# Patient Record
Sex: Male | Born: 1935 | Race: White | Hispanic: No | Smoking: Former smoker
Health system: Southern US, Community
[De-identification: ages and names within clinical notes are randomized; demographics above are authoritative.]

## PROBLEM LIST (undated history)

## (undated) DIAGNOSIS — Z7409 Other reduced mobility: Secondary | ICD-10-CM

## (undated) DIAGNOSIS — J449 Chronic obstructive pulmonary disease, unspecified: Secondary | ICD-10-CM

## (undated) DIAGNOSIS — B351 Tinea unguium: Secondary | ICD-10-CM

## (undated) DIAGNOSIS — C801 Malignant (primary) neoplasm, unspecified: Secondary | ICD-10-CM

## (undated) DIAGNOSIS — N189 Chronic kidney disease, unspecified: Secondary | ICD-10-CM

## (undated) DIAGNOSIS — R011 Cardiac murmur, unspecified: Secondary | ICD-10-CM

## (undated) DIAGNOSIS — D61818 Other pancytopenia: Secondary | ICD-10-CM

## (undated) DIAGNOSIS — R55 Syncope and collapse: Secondary | ICD-10-CM

## (undated) DIAGNOSIS — B029 Zoster without complications: Secondary | ICD-10-CM

## (undated) DIAGNOSIS — K635 Polyp of colon: Secondary | ICD-10-CM

## (undated) DIAGNOSIS — M109 Gout, unspecified: Secondary | ICD-10-CM

## (undated) DIAGNOSIS — H409 Unspecified glaucoma: Secondary | ICD-10-CM

## (undated) DIAGNOSIS — S4290XA Fracture of unspecified shoulder girdle, part unspecified, initial encounter for closed fracture: Secondary | ICD-10-CM

## (undated) DIAGNOSIS — K5900 Constipation, unspecified: Secondary | ICD-10-CM

## (undated) DIAGNOSIS — I340 Nonrheumatic mitral (valve) insufficiency: Secondary | ICD-10-CM

## (undated) HISTORY — PX: ADENOIDECTOMY: SUR15

## (undated) HISTORY — DX: Chronic kidney disease, unspecified: N18.9

## (undated) HISTORY — PX: EYE SURGERY: SHX253

## (undated) HISTORY — PX: TONSILLECTOMY: SUR1361

---

## 1998-01-19 ENCOUNTER — Encounter: Payer: Self-pay | Admitting: Internal Medicine

## 1998-01-19 ENCOUNTER — Ambulatory Visit (HOSPITAL_COMMUNITY): Admission: RE | Admit: 1998-01-19 | Discharge: 1998-01-19 | Payer: Self-pay | Admitting: Internal Medicine

## 1998-12-24 ENCOUNTER — Encounter: Payer: Self-pay | Admitting: Internal Medicine

## 1998-12-24 ENCOUNTER — Encounter: Admission: RE | Admit: 1998-12-24 | Discharge: 1998-12-24 | Payer: Self-pay | Admitting: Internal Medicine

## 2000-01-05 ENCOUNTER — Encounter (INDEPENDENT_AMBULATORY_CARE_PROVIDER_SITE_OTHER): Payer: Self-pay | Admitting: Specialist

## 2000-01-05 ENCOUNTER — Ambulatory Visit (HOSPITAL_COMMUNITY): Admission: RE | Admit: 2000-01-05 | Discharge: 2000-01-05 | Payer: Self-pay | Admitting: Gastroenterology

## 2001-08-26 ENCOUNTER — Inpatient Hospital Stay (HOSPITAL_COMMUNITY): Admission: EM | Admit: 2001-08-26 | Discharge: 2001-08-28 | Payer: Self-pay | Admitting: Emergency Medicine

## 2001-08-26 ENCOUNTER — Encounter: Payer: Self-pay | Admitting: Emergency Medicine

## 2001-08-27 ENCOUNTER — Encounter: Payer: Self-pay | Admitting: Cardiovascular Disease

## 2001-09-04 ENCOUNTER — Ambulatory Visit (HOSPITAL_COMMUNITY): Admission: RE | Admit: 2001-09-04 | Discharge: 2001-09-04 | Payer: Self-pay | Admitting: Cardiology

## 2002-11-07 ENCOUNTER — Ambulatory Visit (HOSPITAL_COMMUNITY): Admission: RE | Admit: 2002-11-07 | Discharge: 2002-11-07 | Payer: Self-pay | Admitting: Cardiology

## 2008-05-15 ENCOUNTER — Ambulatory Visit: Payer: Self-pay | Admitting: Internal Medicine

## 2008-05-26 LAB — MORPHOLOGY

## 2008-05-26 LAB — COMPREHENSIVE METABOLIC PANEL
ALT: 14 U/L (ref 0–53)
BUN: 14 mg/dL (ref 6–23)
CO2: 20 mEq/L (ref 19–32)
Calcium: 9.3 mg/dL (ref 8.4–10.5)
Chloride: 114 mEq/L — ABNORMAL HIGH (ref 96–112)
Creatinine, Ser: 1.03 mg/dL (ref 0.40–1.50)
Glucose, Bld: 81 mg/dL (ref 70–99)
Total Bilirubin: 0.4 mg/dL (ref 0.3–1.2)

## 2008-05-26 LAB — CBC WITH DIFFERENTIAL/PLATELET
Basophils Absolute: 0 10*3/uL (ref 0.0–0.1)
Eosinophils Absolute: 0 10*3/uL (ref 0.0–0.5)
HCT: 38.8 % (ref 38.4–49.9)
HGB: 12.9 g/dL — ABNORMAL LOW (ref 13.0–17.1)
LYMPH%: 26.9 % (ref 14.0–49.0)
MCHC: 33.2 g/dL (ref 32.0–36.0)
MONO#: 1.4 10*3/uL — ABNORMAL HIGH (ref 0.1–0.9)
NEUT%: 40.8 % (ref 39.0–75.0)
Platelets: 147 10*3/uL (ref 140–400)
WBC: 4.4 10*3/uL (ref 4.0–10.3)
lymph#: 1.2 10*3/uL (ref 0.9–3.3)

## 2008-05-26 LAB — LACTATE DEHYDROGENASE: LDH: 131 U/L (ref 94–250)

## 2008-05-26 LAB — FOLATE: Folate: >20 ng/mL

## 2008-07-17 ENCOUNTER — Ambulatory Visit: Payer: Self-pay | Admitting: Internal Medicine

## 2008-07-21 LAB — CBC WITH DIFFERENTIAL/PLATELET
BASO%: 0.2 % (ref 0.0–2.0)
EOS%: 1.9 % (ref 0.0–7.0)
LYMPH%: 29.6 % (ref 14.0–49.0)
MCH: 33.6 pg — ABNORMAL HIGH (ref 27.2–33.4)
MCHC: 33.2 g/dL (ref 32.0–36.0)
MONO#: 1.5 10*3/uL — ABNORMAL HIGH (ref 0.1–0.9)
NEUT%: 37 % — ABNORMAL LOW (ref 39.0–75.0)
Platelets: 139 10*3/uL — ABNORMAL LOW (ref 140–400)
RBC: 3.81 10*6/uL — ABNORMAL LOW (ref 4.20–5.82)
WBC: 4.8 10*3/uL (ref 4.0–10.3)
lymph#: 1.4 10*3/uL (ref 0.9–3.3)
nRBC: 0 % (ref 0–0)

## 2008-08-26 ENCOUNTER — Encounter: Admission: RE | Admit: 2008-08-26 | Discharge: 2008-08-26 | Payer: Self-pay | Admitting: Internal Medicine

## 2008-08-26 ENCOUNTER — Ambulatory Visit: Payer: Self-pay | Admitting: Internal Medicine

## 2008-08-28 LAB — CBC WITH DIFFERENTIAL/PLATELET
BASO%: 0 % (ref 0.0–2.0)
EOS%: 0.5 % (ref 0.0–7.0)
MCH: 33.4 pg (ref 27.2–33.4)
MCHC: 32.9 g/dL (ref 32.0–36.0)
MONO#: 1.2 10*3/uL — ABNORMAL HIGH (ref 0.1–0.9)
RBC: 3.35 10*6/uL — ABNORMAL LOW (ref 4.20–5.82)
WBC: 4.4 10*3/uL (ref 4.0–10.3)
lymph#: 1.2 10*3/uL (ref 0.9–3.3)
nRBC: 0 % (ref 0–0)

## 2008-08-28 LAB — FOLATE: Folate: >20 ng/mL

## 2008-09-05 ENCOUNTER — Ambulatory Visit (HOSPITAL_COMMUNITY): Admission: RE | Admit: 2008-09-05 | Discharge: 2008-09-05 | Payer: Self-pay | Admitting: Internal Medicine

## 2008-09-05 ENCOUNTER — Ambulatory Visit: Payer: Self-pay | Admitting: Internal Medicine

## 2008-09-05 ENCOUNTER — Encounter: Payer: Self-pay | Admitting: Internal Medicine

## 2008-09-17 LAB — CBC WITH DIFFERENTIAL/PLATELET
BASO%: 0.2 % (ref 0.0–2.0)
Basophils Absolute: 0 10*3/uL (ref 0.0–0.1)
EOS%: 1.9 % (ref 0.0–7.0)
HGB: 11.8 g/dL — ABNORMAL LOW (ref 13.0–17.1)
MCH: 33.2 pg (ref 27.2–33.4)
MCHC: 32.2 g/dL (ref 32.0–36.0)
RBC: 3.55 10*6/uL — ABNORMAL LOW (ref 4.20–5.82)
RDW: 14.4 % (ref 11.0–14.6)
lymph#: 1.4 10*3/uL (ref 0.9–3.3)
nRBC: 0 % (ref 0–0)

## 2008-09-17 LAB — LACTATE DEHYDROGENASE: LDH: 114 U/L (ref 94–250)

## 2009-03-12 ENCOUNTER — Ambulatory Visit (HOSPITAL_COMMUNITY): Admission: RE | Admit: 2009-03-12 | Discharge: 2009-03-12 | Payer: Self-pay | Admitting: Internal Medicine

## 2009-03-12 ENCOUNTER — Ambulatory Visit: Payer: Self-pay | Admitting: Internal Medicine

## 2009-03-12 LAB — CBC & DIFF AND RETIC
BASO%: 0.4 % (ref 0.0–2.0)
Basophils Absolute: 0 10*3/uL (ref 0.0–0.1)
EOS%: 0.9 % (ref 0.0–7.0)
HGB: 12.6 g/dL — ABNORMAL LOW (ref 13.0–17.1)
Immature Retic Fract: 7.6 % (ref 0.00–13.40)
MCH: 32.8 pg (ref 27.2–33.4)
RBC: 3.84 10*6/uL — ABNORMAL LOW (ref 4.20–5.82)
RDW: 14.1 % (ref 11.0–14.6)
Retic %: 1.02 % (ref 0.50–1.60)
Retic Ct Abs: 39.17 10*3/uL (ref 24.10–77.50)
lymph#: 1.3 10*3/uL (ref 0.9–3.3)

## 2009-03-12 LAB — LACTATE DEHYDROGENASE: LDH: 116 U/L (ref 94–250)

## 2009-03-12 LAB — IRON AND TIBC
%SAT: 38 % (ref 20–55)
Iron: 105 ug/dL (ref 42–165)
UIBC: 171 ug/dL

## 2009-03-12 LAB — COMPREHENSIVE METABOLIC PANEL
ALT: 16 U/L (ref 0–53)
AST: 18 U/L (ref 0–37)
BUN: 16 mg/dL (ref 6–23)
Calcium: 9 mg/dL (ref 8.4–10.5)
Chloride: 115 mEq/L — ABNORMAL HIGH (ref 96–112)
Creatinine, Ser: 1 mg/dL (ref 0.40–1.50)
Total Bilirubin: 0.5 mg/dL (ref 0.3–1.2)

## 2009-03-12 LAB — FERRITIN: Ferritin: 228 ng/mL (ref 22–322)

## 2009-03-12 LAB — ERYTHROPOIETIN: Erythropoietin: 15.3 m[IU]/mL (ref 2.6–34.0)

## 2009-09-14 ENCOUNTER — Ambulatory Visit: Payer: Self-pay | Admitting: Internal Medicine

## 2009-09-16 ENCOUNTER — Ambulatory Visit (HOSPITAL_COMMUNITY): Admission: RE | Admit: 2009-09-16 | Discharge: 2009-09-16 | Payer: Self-pay | Admitting: Internal Medicine

## 2009-09-16 LAB — CBC WITH DIFFERENTIAL/PLATELET
BASO%: 0.2 % (ref 0.0–2.0)
EOS%: 0.5 % (ref 0.0–7.0)
HCT: 38.5 % (ref 38.4–49.9)
LYMPH%: 31 % (ref 14.0–49.0)
MCH: 32.8 pg (ref 27.2–33.4)
MCHC: 33 g/dL (ref 32.0–36.0)
MONO#: 1.3 10*3/uL — ABNORMAL HIGH (ref 0.1–0.9)
NEUT%: 36.8 % — ABNORMAL LOW (ref 39.0–75.0)
Platelets: 140 10*3/uL (ref 140–400)

## 2009-09-16 LAB — VITAMIN B12: Vitamin B-12: 1412 pg/mL — ABNORMAL HIGH (ref 211–911)

## 2009-09-16 LAB — FOLATE: Folate: >20 ng/mL

## 2009-09-16 LAB — FERRITIN: Ferritin: 203 ng/mL (ref 22–322)

## 2009-09-16 LAB — IRON AND TIBC: TIBC: 269 ug/dL (ref 215–435)

## 2010-03-22 ENCOUNTER — Other Ambulatory Visit: Payer: Self-pay | Admitting: Internal Medicine

## 2010-03-22 ENCOUNTER — Encounter (HOSPITAL_BASED_OUTPATIENT_CLINIC_OR_DEPARTMENT_OTHER): Payer: Medicare Other | Admitting: Internal Medicine

## 2010-03-22 DIAGNOSIS — D531 Other megaloblastic anemias, not elsewhere classified: Secondary | ICD-10-CM

## 2010-03-22 DIAGNOSIS — D539 Nutritional anemia, unspecified: Secondary | ICD-10-CM

## 2010-03-22 DIAGNOSIS — J984 Other disorders of lung: Secondary | ICD-10-CM

## 2010-03-22 LAB — CBC WITH DIFFERENTIAL/PLATELET
BASO%: 0.2 % (ref 0.0–2.0)
Basophils Absolute: 0 10*3/uL (ref 0.0–0.1)
EOS%: 3.4 % (ref 0.0–7.0)
Eosinophils Absolute: 0.2 10*3/uL (ref 0.0–0.5)
HCT: 41.4 % (ref 38.4–49.9)
HGB: 13.3 g/dL (ref 13.0–17.1)
LYMPH%: 21.6 % (ref 14.0–49.0)
MCH: 32.3 pg (ref 27.2–33.4)
MCHC: 32.1 g/dL (ref 32.0–36.0)
MCV: 100.5 fL — ABNORMAL HIGH (ref 79.3–98.0)
MONO#: 1.7 10*3/uL — ABNORMAL HIGH (ref 0.1–0.9)
MONO%: 36.3 % — ABNORMAL HIGH (ref 0.0–14.0)
NEUT#: 1.8 10*3/uL (ref 1.5–6.5)
NEUT%: 38.5 % — ABNORMAL LOW (ref 39.0–75.0)
Platelets: 142 10*3/uL (ref 140–400)
RBC: 4.12 10*6/uL — ABNORMAL LOW (ref 4.20–5.82)
RDW: 14.6 % (ref 11.0–14.6)
WBC: 4.8 10*3/uL (ref 4.0–10.3)
lymph#: 1 10*3/uL (ref 0.9–3.3)
nRBC: 0 % (ref 0–0)

## 2010-03-22 LAB — IRON AND TIBC
%SAT: 37 % (ref 20–55)
Iron: 108 ug/dL (ref 42–165)
TIBC: 294 ug/dL (ref 215–435)

## 2010-03-25 ENCOUNTER — Encounter (HOSPITAL_BASED_OUTPATIENT_CLINIC_OR_DEPARTMENT_OTHER): Payer: Medicare Other | Admitting: Internal Medicine

## 2010-03-25 DIAGNOSIS — J984 Other disorders of lung: Secondary | ICD-10-CM

## 2010-03-25 DIAGNOSIS — D539 Nutritional anemia, unspecified: Secondary | ICD-10-CM

## 2010-04-23 LAB — POCT I-STAT, CHEM 8
Calcium, Ion: 1.24 mmol/L (ref 1.12–1.32)
Chloride: 112 mEq/L (ref 96–112)
Creatinine, Ser: 1.1 mg/dL (ref 0.4–1.5)
Glucose, Bld: 95 mg/dL (ref 70–99)
HCT: 42 % (ref 39.0–52.0)
Potassium: 4.3 mEq/L (ref 3.5–5.1)

## 2010-05-16 LAB — DIFFERENTIAL
Basophils Absolute: 0 10*3/uL (ref 0.0–0.1)
Lymphocytes Relative: 19 % (ref 12–46)
Monocytes Relative: 28 % — ABNORMAL HIGH (ref 3–12)
Neutro Abs: 3 10*3/uL (ref 1.7–7.7)
Neutrophils Relative %: 52 % (ref 43–77)

## 2010-05-16 LAB — CBC
HCT: 34.4 % — ABNORMAL LOW (ref 39.0–52.0)
Hemoglobin: 10.9 g/dL — ABNORMAL LOW (ref 13.0–17.0)
MCHC: 31.6 g/dL (ref 30.0–36.0)
Platelets: 141 10*3/uL — ABNORMAL LOW (ref 150–400)
RDW: 15.7 % — ABNORMAL HIGH (ref 11.5–15.5)

## 2010-05-16 LAB — BONE MARROW EXAM

## 2010-05-16 LAB — CHROMOSOME ANALYSIS, BONE MARROW

## 2010-06-25 NOTE — Discharge Summary (Signed)
[place]  Patient:    Colin Hudson, Colin Hudson Visit Number: [id number] MRN: [id number]          Service Type: MED Location: [phone] Attending Physician:  Vashti Hey Dictated by:   Janae Bridgeman Eloise Harman., M.D. Admit Date:  08/26/2001 Discharge Date: 08/28/2001                             Discharge Summary  DATE OF BIRTH:  1918/05/15  DISCHARGE DIAGNOSES: 1. Syncopal episode, uncertain etiology, but suspect cardiac source. 2. Sicca syndrome.  HISTORY OF PRESENT ILLNESS:  This 77 year old, white male was seated in church and suddenly slumped over unconscious.  He was unconscious for several minutes.  He was a little confused when he came to, but there was no sign of seizure activity and no neurologic deficits.  He was transported to the emergency room and subsequently admitted for further evaluation and monitoring.  HOSPITAL COURSE:  The patient was placed on the telemetry floor and monitored expectantly.  He had no untoward arrhythmias during his 48 hours of observation.  He had serial EKGs and enzymes which were unremarkable.  He had a 2D echocardiogram, the report of which is not available at this time.  He had carotid Dopplers which showed no internal carotid artery stenosis and antegrade vertebral artery blood flow according to the technicians preliminary report.  His other labs included a normal TSH at 1.47.  CBC was normal with a white count of 6000, hemoglobin 14.1, hematocrit 42%, platelet count 203,000. His red cell MCV was 94.3.  His routine chemistries were completely unremarkable.  CT scan of his head showed no acute process.  There was minimal small vessel ischemic changes in the periventricular white matter bilaterally. His EKG showed normal sinus rhythm with left anterior fascicular block on several tracings.  His initial study showed sinus bradycardia with a rate of 55 per minute.  CONSULTATIONS:   None.  TRANSFUSIONS:  None.  NOSOCOMIAL INFECTIONS:  None.  SPECIAL INSTRUCTIONS:  The patient was advised, although not proven, it was suspected that he was having early signs of sick sinus syndrome with sinus arrest and/or heart block.  He was advised not to drive until further notice. He would return to the office in 7-10 days for further evaluation.  It is felt likely that he will need insertion of a loop recorder to monitor him on an extended period of time.  DIET:  Regular.  ACTIVITY:  As tolerated, except for driving.  FOLLOWUP:  Return to the office in 7-10 days for further evaluation.  CONDITION ON DISCHARGE:  Stable.  Dictated by:   Janae Bridgeman Eloise Harman., M.D.  Attending Physician:  Vashti Hey DD:  08/28/01 TD:  09/03/01 Job: [id number]

## 2010-06-25 NOTE — Op Note (Signed)
   NAMEKOLYN, ROZARIO                       ACCOUNT NO.:  [id number]   MEDICAL RECORD NO.:  [id number]                   PATIENT TYPE:  OIB   LOCATION:  2899                                 FACILITY:  [facility]   PHYSICIAN:  Aram Candela. Tysinger, M.D.              DATE OF BIRTH:  [birth year]   DATE OF PROCEDURE:  11/07/2002  DATE OF DISCHARGE:                                 OPERATIVE REPORT   OPERATION PERFORMED:  Removal of loop recorder.   CARDIOLOGIST:  Aram Candela. Aleen Campi, M.D.   ANESTHESIA:  Local with 1% lidocaine.   DESCRIPTION OF PROCEDURE:  After signing an informed consent the patient was  premedicated with 5 mg of Valium by mouth and preloaded with 1 gram of Ancef  IV prior to being brought to the cardiac cath lab at [facility].  His left anterior chest, face and neck were prepped and draped in a sterile  fashion.  A left transverse plane was anesthetized locally overlying the  existing loop recorder.  An incision was made in this anesthetized plane  with the incision being deepened into the fibrous layer overlying the loop  recorder.  This fibrous layer was incised exposing the loop recorder, which  was removed from the pocket.   After obtaining hemostasis the pocket was lavaged profusely with a kanamycin  solution.  The wound was then closed in layers using 2-0 Dexon.  Final skin  closure was obtained with a cutaneous layer of Steri-strips.   The patient tolerated the procedure well and no complications were noted at  the end of the procedure.  The wound was dressed with a sterile bulky  dressing, and he was returned to the short stay unit to arrange for one  further dose of Ancef prior to discharge home later today.                                                 John R. Aleen Campi, M.D.    JRT/MEDQ  D:  11/07/2002  T:  11/07/2002  Job:  [id number]   cc:   [facility] Catheterization Laboratory

## 2010-06-25 NOTE — H&P (Signed)
[place]. [facility]  Patient:    Colin Hudson, Colin Hudson Visit Number: [id number] MRN: [id number]          Service Type: MED Location: [phone] Attending Physician:  Vashti Hey Dictated by:   Lemmie Evens, M.D. Admit Date:  08/26/2001 Discharge Date: 08/28/2001                           History and Physical  HISTORY OF PRESENT ILLNESS:  The patient is an 77 year old white male who came to the emergency room today complaining of episode of fainting.  Apparently, the patient was attending church services Sunday morning and became suddenly unconscious for at least one to two minutes according to the patients wife who accompanied him.  The patient did awake spontaneously and was brought to the emergency room by ambulance, and was found to be in good condition subsequently.  Because of the episode of syncope, is being admitted for observation, and evaluation and treatment.  The patient has had one previous episode of near syncope about five years ago. Apparently, did have a pneumonia at that time.  He denies headache, dizziness, blurred vision, head injury, chest or abdominal complaints, diarrhea, fever, weight loss, or peripheral numbness.  The patient does have a past history of asthma for which he takes bronchodilators.  ALLERGIES:  ASPIRIN which caused oral mucosal swelling.  HABITS:  The patient does not smoke cigarettes.  He does drink one or two alcoholic beverages per day.  PAST MEDICAL HISTORY:  The patient does have a history of glaucoma for which she uses eye drops.  Also, he has had significant dry eyes in the past.  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  PHYSICAL EXAMINATION:  GENERAL:  The patient appears to be healthy and well-nourished.  EXTREMITIES:  Revealed good range of motion of upper and lower extremities with good strength in the muscle groups  proximally.  NECK:  Exam reveals good neck motion.  There is no thyromegaly or adenopathy.  HEENT:  Eye exam reveals conjunctival erythema bilaterally.  LUNGS:  Exam reveals clear breath sounds bilaterally without rales.  HEART:  Exam revealed heart rate of 50, a grade 2/6 systolic ejection murmur was heard at the left sternal border to the base of the heart.  ABDOMEN:  Exam revealed a soft abdomen without hepatosplenomegaly.  NEUROLOGICAL:  Exam appeared normal with good reflexes bilaterally and negative Babinski sign bilaterally.  There is no nystagmus noted on eye exam.  ASSESSMENT:  The patient has had an episode of syncope, lasting about one to two minutes.  His wife did not notice evidence of seizure activity.  He will be placed on a monitor for 24 hours.  An echocardiogram of the heart will be obtained. Dictated by:   Lemmie Evens, M.D. Attending Physician:  Vashti Hey DD:  08/26/01 TD:  08/30/01 Job: [id number]

## 2010-06-25 NOTE — Op Note (Signed)
   NAMEJAYDENCE, Colin Hudson                       ACCOUNT NO.:  [id number]   MEDICAL RECORD NO.:  [id number]                   PATIENT TYPE:  OIB   LOCATION:  2856                                 FACILITY:  [facility]   PHYSICIAN:  Aram Candela. Tysinger, M.D.              DATE OF BIRTH:  1918-03-28   DATE OF PROCEDURE:  09/04/2001  DATE OF DISCHARGE:  09/04/2001                                 OPERATIVE REPORT   PROCEDURE:  Insertion of  loop recorder.   INDICATION FOR PROCEDURE:  Syncope with collapse.   SURGEON:  Aram Candela. Tysinger, M.D.   DESCRIPTION OF PROCEDURE:  After signing an informed consent, the patient  was premedicated with 50 mg of Benadryl intravenously and brought to the  cardiac catheterization lab.  His left anterior chest and face and neck were  prepped and draped in a sterile fashion and a left transverse subclavicular  plane was anesthetized locally with 1% lidocaine; this was two  fingerbreadths left of the left sternal border and two fingerbreadths below  the sternum.  An incision was made in this anesthetized plane and a small  pocket was made one fingerbreadth in width and length subcutaneously in this  left anterior chest pain.  We then lavaged this pocket profusely with  kanamycin solution.  We then selected a [employer] loop recorder device  called Reveal Plus, model [id number], serial [id number].  This device was then  inserted in the prepared subcutaneous pocket and secured with two sutures of  2-0 silk.  The wound was then closed in layers using 2-0 Dexon.  Final skin  closure was obtained with a cutaneous layer of Steri-Strips.  The patient  tolerated the procedure well and no complications were noted.  At the end of  the procedure, a sterile bulky dressing  was applied to the wound and he was  returned to the short-stay unit to await a second dose of Ancef before  discharge home.   He was given wound care instructions and will follow up in one week in the   office.                                                   John R. Aleen Campi, M.D.    JRT/MEDQ  D:  09/04/2001  T:  09/10/2001  Job:  [id number]   cc:   Cath Lab, [facility]

## 2013-03-24 ENCOUNTER — Emergency Department (HOSPITAL_COMMUNITY): Payer: Medicare Other

## 2013-03-24 ENCOUNTER — Encounter (HOSPITAL_COMMUNITY): Payer: Self-pay | Admitting: Emergency Medicine

## 2013-03-24 ENCOUNTER — Inpatient Hospital Stay (HOSPITAL_COMMUNITY): Payer: Medicare Other

## 2013-03-24 ENCOUNTER — Inpatient Hospital Stay (HOSPITAL_COMMUNITY)
Admission: EM | Admit: 2013-03-24 | Discharge: 2013-03-28 | DRG: 280 | Disposition: A | Discharge status: Skilled Nursing Facility | Payer: Medicare Other | Attending: Internal Medicine | Admitting: Internal Medicine

## 2013-03-24 DIAGNOSIS — Z87891 Personal history of nicotine dependence: Secondary | ICD-10-CM | POA: Diagnosis not present

## 2013-03-24 DIAGNOSIS — R55 Syncope and collapse: Secondary | ICD-10-CM

## 2013-03-24 DIAGNOSIS — D696 Thrombocytopenia, unspecified: Secondary | ICD-10-CM | POA: Diagnosis present

## 2013-03-24 DIAGNOSIS — M109 Gout, unspecified: Secondary | ICD-10-CM | POA: Diagnosis present

## 2013-03-24 DIAGNOSIS — R197 Diarrhea, unspecified: Secondary | ICD-10-CM

## 2013-03-24 DIAGNOSIS — R5381 Other malaise: Secondary | ICD-10-CM | POA: Diagnosis present

## 2013-03-24 DIAGNOSIS — Z66 Do not resuscitate: Secondary | ICD-10-CM | POA: Diagnosis present

## 2013-03-24 DIAGNOSIS — I428 Other cardiomyopathies: Secondary | ICD-10-CM | POA: Diagnosis present

## 2013-03-24 DIAGNOSIS — R011 Cardiac murmur, unspecified: Secondary | ICD-10-CM

## 2013-03-24 DIAGNOSIS — J449 Chronic obstructive pulmonary disease, unspecified: Secondary | ICD-10-CM | POA: Diagnosis present

## 2013-03-24 DIAGNOSIS — R112 Nausea with vomiting, unspecified: Secondary | ICD-10-CM

## 2013-03-24 DIAGNOSIS — D4819 Other specified neoplasm of uncertain behavior of connective and other soft tissue: Secondary | ICD-10-CM | POA: Diagnosis present

## 2013-03-24 DIAGNOSIS — H409 Unspecified glaucoma: Secondary | ICD-10-CM | POA: Diagnosis present

## 2013-03-24 DIAGNOSIS — E872 Acidosis, unspecified: Secondary | ICD-10-CM | POA: Diagnosis present

## 2013-03-24 DIAGNOSIS — I5189 Other ill-defined heart diseases: Secondary | ICD-10-CM

## 2013-03-24 DIAGNOSIS — N179 Acute kidney failure, unspecified: Secondary | ICD-10-CM

## 2013-03-24 DIAGNOSIS — I519 Heart disease, unspecified: Secondary | ICD-10-CM

## 2013-03-24 DIAGNOSIS — I059 Rheumatic mitral valve disease, unspecified: Secondary | ICD-10-CM | POA: Diagnosis present

## 2013-03-24 DIAGNOSIS — W19XXXA Unspecified fall, initial encounter: Secondary | ICD-10-CM | POA: Diagnosis present

## 2013-03-24 DIAGNOSIS — M6282 Rhabdomyolysis: Secondary | ICD-10-CM | POA: Diagnosis present

## 2013-03-24 DIAGNOSIS — I214 Non-ST elevation (NSTEMI) myocardial infarction: Secondary | ICD-10-CM | POA: Diagnosis present

## 2013-03-24 DIAGNOSIS — I6350 Cerebral infarction due to unspecified occlusion or stenosis of unspecified cerebral artery: Secondary | ICD-10-CM | POA: Diagnosis present

## 2013-03-24 DIAGNOSIS — J4489 Other specified chronic obstructive pulmonary disease: Secondary | ICD-10-CM | POA: Diagnosis present

## 2013-03-24 DIAGNOSIS — D481 Neoplasm of uncertain behavior of connective and other soft tissue: Secondary | ICD-10-CM | POA: Diagnosis present

## 2013-03-24 DIAGNOSIS — R4182 Altered mental status, unspecified: Secondary | ICD-10-CM

## 2013-03-24 DIAGNOSIS — I359 Nonrheumatic aortic valve disorder, unspecified: Secondary | ICD-10-CM | POA: Diagnosis present

## 2013-03-24 DIAGNOSIS — E860 Dehydration: Secondary | ICD-10-CM

## 2013-03-24 DIAGNOSIS — D649 Anemia, unspecified: Secondary | ICD-10-CM

## 2013-03-24 DIAGNOSIS — R7989 Other specified abnormal findings of blood chemistry: Secondary | ICD-10-CM

## 2013-03-24 DIAGNOSIS — Z886 Allergy status to analgesic agent status: Secondary | ICD-10-CM

## 2013-03-24 DIAGNOSIS — N19 Unspecified kidney failure: Secondary | ICD-10-CM

## 2013-03-24 DIAGNOSIS — E870 Hyperosmolality and hypernatremia: Secondary | ICD-10-CM | POA: Diagnosis present

## 2013-03-24 DIAGNOSIS — R1900 Intra-abdominal and pelvic swelling, mass and lump, unspecified site: Secondary | ICD-10-CM | POA: Diagnosis present

## 2013-03-24 HISTORY — DX: Malignant (primary) neoplasm, unspecified: C80.1

## 2013-03-24 HISTORY — DX: Gout, unspecified: M10.9

## 2013-03-24 HISTORY — DX: Unspecified glaucoma: H40.9

## 2013-03-24 HISTORY — DX: Chronic obstructive pulmonary disease, unspecified: J44.9

## 2013-03-24 HISTORY — DX: Polyp of colon: K63.5

## 2013-03-24 HISTORY — DX: Fracture of unspecified shoulder girdle, part unspecified, initial encounter for closed fracture: S42.90XA

## 2013-03-24 HISTORY — DX: Syncope and collapse: R55

## 2013-03-24 HISTORY — DX: Nonrheumatic mitral (valve) insufficiency: I34.0

## 2013-03-24 HISTORY — DX: Other pancytopenia: D61.818

## 2013-03-24 HISTORY — DX: Tinea unguium: B35.1

## 2013-03-24 HISTORY — DX: Zoster without complications: B02.9

## 2013-03-24 LAB — CBC WITH DIFFERENTIAL/PLATELET
BASOS PCT: 0 % (ref 0–1)
Basophils Absolute: 0 10*3/uL (ref 0.0–0.1)
Eosinophils Absolute: 0 10*3/uL (ref 0.0–0.7)
Eosinophils Relative: 0 % (ref 0–5)
HEMATOCRIT: 37.9 % — AB (ref 39.0–52.0)
HEMOGLOBIN: 12.7 g/dL — AB (ref 13.0–17.0)
LYMPHS ABS: 1.6 10*3/uL (ref 0.7–4.0)
Lymphocytes Relative: 16 % (ref 12–46)
MCH: 32.3 pg (ref 26.0–34.0)
MCHC: 33.5 g/dL (ref 30.0–36.0)
MCV: 96.4 fL (ref 78.0–100.0)
MONO ABS: 1.6 10*3/uL — AB (ref 0.1–1.0)
MONOS PCT: 16 % — AB (ref 3–12)
NEUTROS ABS: 6.9 10*3/uL (ref 1.7–7.7)
Neutrophils Relative %: 68 % (ref 43–77)
Platelets: 97 10*3/uL — ABNORMAL LOW (ref 150–400)
RBC: 3.93 MIL/uL — ABNORMAL LOW (ref 4.22–5.81)
RDW: 14.8 % (ref 11.5–15.5)
WBC: 10.2 10*3/uL (ref 4.0–10.5)

## 2013-03-24 LAB — COMPREHENSIVE METABOLIC PANEL
ALBUMIN: 3.7 g/dL (ref 3.5–5.2)
ALT: 36 U/L (ref 0–53)
AST: 106 U/L — ABNORMAL HIGH (ref 0–37)
Alkaline Phosphatase: 105 U/L (ref 39–117)
BILIRUBIN TOTAL: 0.3 mg/dL (ref 0.3–1.2)
BUN: 43 mg/dL — AB (ref 6–23)
CHLORIDE: 115 meq/L — AB (ref 96–112)
CO2: 16 mEq/L — ABNORMAL LOW (ref 19–32)
CREATININE: 1.38 mg/dL — AB (ref 0.50–1.35)
Calcium: 9.1 mg/dL (ref 8.4–10.5)
GFR, EST AFRICAN AMERICAN: 50 mL/min — AB (ref >90)
GFR, EST NON AFRICAN AMERICAN: 43 mL/min — AB (ref >90)
GLUCOSE: 105 mg/dL — AB (ref 70–99)
Potassium: 3.8 mEq/L (ref 3.7–5.3)
Sodium: 148 mEq/L — ABNORMAL HIGH (ref 137–147)
Total Protein: 7.8 g/dL (ref 6.0–8.3)

## 2013-03-24 LAB — URINALYSIS, ROUTINE W REFLEX MICROSCOPIC
Bilirubin Urine: NEGATIVE
GLUCOSE, UA: NEGATIVE mg/dL
KETONES UR: 15 mg/dL — AB
LEUKOCYTES UA: NEGATIVE
NITRITE: NEGATIVE
PROTEIN: 100 mg/dL — AB
Specific Gravity, Urine: 1.037 — ABNORMAL HIGH (ref 1.005–1.030)
UROBILINOGEN UA: 0.2 mg/dL (ref 0.0–1.0)
pH: 6 (ref 5.0–8.0)

## 2013-03-24 LAB — RETICULOCYTES
RBC.: 3.67 MIL/uL — ABNORMAL LOW (ref 4.22–5.81)
RETIC CT PCT: 0.9 % (ref 0.4–3.1)
Retic Count, Absolute: 33 10*3/uL (ref 19.0–186.0)

## 2013-03-24 LAB — LIPASE, BLOOD: LIPASE: 14 U/L (ref 11–59)

## 2013-03-24 LAB — POCT I-STAT TROPONIN I: Troponin i, poc: 0.58 ng/mL (ref 0.00–0.08)

## 2013-03-24 LAB — LACTATE DEHYDROGENASE: LDH: 248 U/L (ref 94–250)

## 2013-03-24 LAB — TROPONIN I: Troponin I: 1.12 ng/mL (ref <0.30)

## 2013-03-24 LAB — CK: Total CK: 1893 U/L — ABNORMAL HIGH (ref 7–232)

## 2013-03-24 LAB — URINE MICROSCOPIC-ADD ON

## 2013-03-24 LAB — CG4 I-STAT (LACTIC ACID): LACTIC ACID, VENOUS: 1.54 mmol/L (ref 0.5–2.2)

## 2013-03-24 MED ORDER — DEXTROSE 5 % IV SOLN
INTRAVENOUS | Status: AC
  Administered 2013-03-25: 03:00:00 via INTRAVENOUS

## 2013-03-24 MED ORDER — POLYVINYL ALCOHOL 1.4 % OP SOLN
1.0000 [drp] | Freq: Every day | OPHTHALMIC | Status: DC | PRN
  Administered 2013-03-25: 1 [drp] via OPHTHALMIC
  Filled 2013-03-24: qty 15

## 2013-03-24 MED ORDER — POLYETHYL GLYCOL-PROPYL GLYCOL 0.4-0.3 % OP SOLN: 1.0000 [drp] | Freq: Every day | OPHTHALMIC | Status: DC | PRN

## 2013-03-24 MED ORDER — PILOCARPINE HCL 4 % OP SOLN
1.0000 [drp] | Freq: Four times a day (QID) | OPHTHALMIC | Status: DC
  Administered 2013-03-25 – 2013-03-28 (×13): 1 [drp] via OPHTHALMIC
  Filled 2013-03-24: qty 15

## 2013-03-24 MED ORDER — ONDANSETRON HCL 4 MG/2ML IJ SOLN
4.0000 mg | Freq: Once | INTRAMUSCULAR | Status: AC
  Administered 2013-03-24: 4 mg via INTRAVENOUS
  Filled 2013-03-24: qty 2

## 2013-03-24 MED ORDER — IOHEXOL 300 MG/ML  SOLN
80.0000 mL | Freq: Once | INTRAMUSCULAR | Status: AC | PRN
  Administered 2013-03-24: 80 mL via INTRAVENOUS

## 2013-03-24 MED ORDER — SODIUM CHLORIDE 0.9 % IV BOLUS (SEPSIS)
1000.0000 mL | Freq: Once | INTRAVENOUS | Status: AC
  Administered 2013-03-24: 1000 mL via INTRAVENOUS

## 2013-03-24 MED ORDER — TRAVOPROST 0.004 % OP SOLN: 1.0000 [drp] | Freq: Every day | OPHTHALMIC | Status: DC

## 2013-03-24 MED ORDER — TETANUS-DIPHTH-ACELL PERTUSSIS 5-2.5-18.5 LF-MCG/0.5 IM SUSP
0.5000 mL | Freq: Once | INTRAMUSCULAR | Status: DC
  Filled 2013-03-24: qty 0.5

## 2013-03-24 MED ORDER — SODIUM CHLORIDE 0.9 % IV SOLN
INTRAVENOUS | Status: DC
  Administered 2013-03-24: 100 mL/h via INTRAVENOUS

## 2013-03-24 MED ORDER — LATANOPROST 0.005 % OP SOLN
1.0000 [drp] | Freq: Every day | OPHTHALMIC | Status: DC
  Administered 2013-03-25 – 2013-03-26 (×3): 1 [drp] via OPHTHALMIC
  Filled 2013-03-24: qty 2.5

## 2013-03-24 MED ORDER — SENNOSIDES-DOCUSATE SODIUM 8.6-50 MG PO TABS
1.0000 | ORAL_TABLET | Freq: Every evening | ORAL | Status: DC | PRN
  Filled 2013-03-24: qty 1

## 2013-03-24 NOTE — ED Notes (Signed)
Family at bedside. 

## 2013-03-24 NOTE — ED Provider Notes (Signed)
TIME SEEN: 3:33 PM  CHIEF COMPLAINT: Abdominal pain, vomiting and diarrhea, possible fall, altered mental status  HPI: Patient is a 80 year old male with a history of COPD, mitral regurgitation who lives at [facility] in an independent living apartment was found on the floor today by a staff member. Patient reports that he has had vomiting and diarrhea for the past several days. He is also had diffuse abdominal cramping. Patient denies that he fell onto the floor but family reports they feel he is not find the truth because they're worried that he thinks he may lose his independence. He is not on anticoagulation. He denies headache. In the reports he has been more confused and "talking out of his head" for the past several days. No focal numbness or weakness. No history of medication changes. Patient denies chest pain or shortness of breath.  ROS: See HPI Constitutional: no fever  Eyes: no drainage  ENT: no runny nose   Cardiovascular:  no chest pain  Resp: no SOB  GI:  vomiting GU: no dysuria Integumentary: no rash  Allergy: no hives  Musculoskeletal: no leg swelling  Neurological: no slurred speech ROS otherwise negative  PAST MEDICAL HISTORY/PAST SURGICAL HISTORY:  Past Medical History  Diagnosis Date  . Pancytopenia     caused by medications he is no longer taking  . Glaucoma   . COPD (chronic obstructive pulmonary disease)   . Mitral regurgitation   . Gout   . Colon polyps   . Cancer     skin  . Shingles   . Syncope   . Onychomycosis     fungal  . Shoulder fracture     MEDICATIONS:  Prior to Admission medications   Not on File    ALLERGIES:  Allergies  Allergen Reactions  . Aspirin Swelling    Face and eyes    SOCIAL HISTORY:  History  Substance Use Topics  . Smoking status: Former Research scientist (life sciences)  . Smokeless tobacco: Not on file  . Alcohol Use: No    FAMILY HISTORY: History reviewed. No pertinent family history.  EXAM: BP 158/69  Pulse 90   Temp(Src) 97.7 F (36.5 C) (Oral)  Resp 23  Ht 6\' 2"  (1.88 m)  SpO2 99% CONSTITUTIONAL: Alert and oriented and responds appropriately to questions. Well-appearing; well-nourished HEAD: Normocephalic EYES: Conjunctivae clear, PERRL ENT: normal nose; no rhinorrhea; very dry mucous membranes; pharynx without lesions noted NECK: Supple, no meningismus, no LAD  CARD: RRR; S1 and S2 appreciated; murmur consistent with mitral regurgitation, no clicks, no rubs, no gallops RESP: Normal chest excursion without splinting or tachypnea; breath sounds clear and equal bilaterally; no wheezes, no rhonchi, no rales,  ABD/GI: Normal bowel sounds; non-distended; soft, non-tender, no rebound, no guarding BACK:  The back appears normal and is non-tender to palpation, there is no CVA tenderness, no midline spinal tenderness or step-off or deformity EXT: Normal ROM in all joints; non-tender to palpation; no edema; normal capillary refill; no cyanosis    SKIN: Normal color for age and race; warm NEURO: Moves all extremities equally PSYCH: The patient's mood and manner are appropriate. Grooming and personal hygiene are appropriate.  MEDICAL DECISION MAKING: Patient here with vomiting and diarrhea and intermittent altered mental status. Will obtain abdominal labs, EKG and troponin, CT head, chest x-ray, urine. Given his vomiting and diarrhea and his appearance of being very dehydrated on exam, will give IV fluids. Will also obtain a CT of his abdomen and pelvis. Patient will likely need  admission.  ED PROGRESS: Patient's labs show hyperchloremia, decreased bicarbonate, acute renal failure all likely due from dehydration from vomiting and diarrhea. His CT scan shows a new 3.7 x 2.5 x 3.8 cm mass at the posterior aspect of the duodenum which is suspicious for a GI stromal tumor. Patient also has calcifications of the aortic valve. He has a 6 mm calculus in the urinary bladder. No sign of obstruction, colitis or  diverticulitis. Patient's troponin is elevated at 1.12. Patient reports he is extremely allergic to aspirin and cannot take it due to swelling. He does not have a cardiologist. Family reports they are not opposed to cardiac catheterization if needed. Patient however is a DO NOT RESUSCITATE. PCP is Dr. Maudie Mercury with [facility].  7:31 PM  D/w Dr. Radford Pax, cardiologist on call. She will see the patient in consult tomorrow morning. We'll admit the hospitalist.      EKG Interpretation    Date/Time:  Sunday March 24 2013 16:42:09 EST Ventricular Rate:  84 PR Interval:  126 QRS Duration: 94 QT Interval:  381 QTC Calculation: 450 R Axis:   -52 Text Interpretation:  Sinus rhythm Left anterior fascicular block Borderline T wave abnormalities No significant change since last tracing Confirmed by WARD  DO, KRISTEN ([id number]) on 03/24/2013 4:49:44 PM             [place], DO 03/25/13 0222

## 2013-03-24 NOTE — ED Notes (Signed)
MD at bedside.-notified MD of lab call troponin 1.12

## 2013-03-24 NOTE — ED Notes (Addendum)
Pt in with nausea, vomiting, and diarrhea x2 days, states his symptoms started on Thursday and worsened daily. Pt states he did fall while in the bathroom but he got himself right up. [facility] reports they received a call from [facility] that pt was found down in his bathroom surrounded by feces and coffee ground emesis.  Pt presents with a purple bruise, swelling, and skin tear to his Right elbow, red abrasions below bilateral ribs, yellow purple bruise to Right lateral shoulder, red spot noted to chin.  Pt states his daughter came by on Friday to bring him something to eat and he didn't tell her he was sick. Daughter did confirm she came by in Friday and he did appear ok, told her he was just "a little tired."

## 2013-03-24 NOTE — ED Notes (Signed)
Gave PT urinal and asked him to try and give [place] a sample

## 2013-03-24 NOTE — Consult Note (Addendum)
Admit date: 03/24/2013 Referring Physician  Dr. Leonides Schanz Primary Cardiologist  None Reason for Consultation  Elevated troponin  HPI: Patient is a 80 year old male with a history of COPD, mitral regurgitation who lives at [facility] in an independent living apartment was found on the floor today by a staff member. Patient reports that he has had vomiting and diarrhea for the past several days. He is also had diffuse abdominal cramping. Patient denies that he fell onto the floor but family reports they feel he is not find the truth because they're worried that he thinks he may lose his independence. He is not on anticoagulation. He denies headache. In the reports he has been more confused and "talking out of his head" for the past several days. No focal numbness or weakness. No history of medication changes. Patient denies chest pain although says he has had some SOB.  He denies any LE edema, palpitations.      PMH:   Past Medical History  Diagnosis Date  . Pancytopenia     caused by medications he is no longer taking  . Glaucoma   . COPD (chronic obstructive pulmonary disease)   . Mitral regurgitation   . Gout   . Colon polyps   . Cancer     skin  . Shingles   . Syncope   . Onychomycosis     fungal  . Shoulder fracture      PSH:   Past Surgical History  Procedure Laterality Date  . Tonsillectomy    . Adenoidectomy    . Eye surgery      laser-glaucoma  . Eye surgery      cataract    Allergies:  Aspirin Prior to Admit Meds:   (Not in a hospital admission) Fam HX:   History reviewed. No pertinent family history. Social HX:    History   Social History  . Marital Status: Widowed    Spouse Name: N/A    Number of Children: N/A  . Years of Education: N/A   Occupational History  . Not on file.   Social History Main Topics  . Smoking status: Former Research scientist (life sciences)  . Smokeless tobacco: Not on file  . Alcohol Use: No  . Drug Use: Not on file  . Sexual Activity: Not on file    Other Topics Concern  . Not on file   Social History Narrative  . No narrative on file     ROS:  All 11 ROS were addressed and are negative except what is stated in the HPI  Physical Exam: Blood pressure 156/67, pulse 81, temperature 97.7 F (36.5 C), temperature source Oral, resp. rate 21, height 6\' 2"  (1.88 m), SpO2 100.00%.    General: Well developed, well nourished, in no acute distress Head: Eyes PERRLA, No xanthomas.   Normal cephalic and atramatic  Lungs:   Clear bilaterally to auscultation and percussion. Heart:   HRRR S1 S2 Pulses are 2+ & equal.2/6 SM at RUSB to LLSB            No carotid bruit. No JVD.  No abdominal bruits. No femoral bruits. Abdomen: Bowel sounds are positive, abdomen soft and non-tender without masses Extremities:   No clubbing, cyanosis or edema.  DP +1 Neuro: Alert and oriented X 3. Psych:  Good affect, responds appropriately    Labs:   Lab Results  Component Value Date   WBC 10.2 03/24/2013   HGB 12.7* 03/24/2013   HCT 37.9* 03/24/2013   MCV  96.4 03/24/2013   PLT 97* 03/24/2013    Recent Labs Lab 03/24/13 1619  NA 148*  K 3.8  CL 115*  CO2 16*  BUN 43*  CREATININE 1.38*  CALCIUM 9.1  PROT 7.8  BILITOT 0.3  ALKPHOS 105  ALT 36  AST 106*  GLUCOSE 105*   No results found for this basename: PTT   No results found for this basename: INR, PROTIME   Lab Results  Component Value Date   CKTOTAL 1893* 03/24/2013   TROPONINI 1.12* 03/24/2013     No results found for this basename: CHOL   No results found for this basename: HDL   No results found for this basename: LDLCALC   No results found for this basename: TRIG   No results found for this basename: CHOLHDL   No results found for this basename: LDLDIRECT      Radiology:  Dg Chest 2 View  03/24/2013   CLINICAL DATA:  Altered mental status.  Shortness of breath.  EXAM: CHEST  2 VIEW  COMPARISON:  CT chest 09/16/2012 and PA and lateral chest 12/11/2012.  FINDINGS: Calcified  pleural plaques are again seen. The lungs are clear. Heart size is normal. No pneumothorax or pleural effusion.  IMPRESSION: No acute finding.  Stable compared to prior exams.   Electronically Signed   By: Inge Rise M.D.   On: 03/24/2013 16:39   Ct Head Wo Contrast  03/24/2013   CLINICAL DATA:  Fall, head trauma  EXAM: CT HEAD WITHOUT CONTRAST  TECHNIQUE: Contiguous axial images were obtained from the base of the skull through the vertex without intravenous contrast.  COMPARISON:  None.  FINDINGS: No intracranial hemorrhage. No parenchymal contusion. No midline shift or mass effect. Basilar cisterns are patent. No skull base fracture. No fluid in the paranasal sinuses or mastoid air cells. There is extra-axial fluid collection anterior to the right frontal lobe is low-density most suggestive of a chronic hygroma. No midline shift or mass effect from this fluid collection.  Visualized cortical atrophy. There is periventricular subcortical white matter hypodensities.  No evidence of skull fracture. No fluid the paranasal sinuses or mastoid air cells.  IMPRESSION: 1. No intracranial trauma. 2. A chronic low-density extra-axial fluid collection anterior to the right frontal lobe likely represents hygroma. 3. Atrophy and microvascular disease. 4. No skull fracture.   Electronically Signed   By: Suzy Bouchard M.D.   On: 03/24/2013 17:59   Ct Abdomen Pelvis W Contrast  03/24/2013   CLINICAL DATA:  Diarrhea and abdominal pain.  EXAM: CT ABDOMEN AND PELVIS WITH CONTRAST  TECHNIQUE: Multidetector CT imaging of the abdomen and pelvis was performed using the standard protocol following bolus administration of intravenous contrast.  CONTRAST:  74mL OMNIPAQUE IOHEXOL 300 MG/ML  SOLN  COMPARISON:  CT of the abdomen and pelvis 06/06/2008.  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 Severe calcifications of the aortic valve.  Atherosclerotic calcifications throughout the right coronary artery.  Abdomen/Pelvis: The appearance of the liver, gallbladder, pancreas, spleen and bilateral adrenal glands is unremarkable. Sub cm low-attenuation lesions in the left kidney are too small to definitively characterize. Mild atrophy of the right kidney. There are 2 simple cysts in the right kidney, largest of which measures 2.6 cm in diameter extending exophytically off the posterior aspect of the lower pole.  Image 38 of series 3 demonstrates a well-circumscribed ovoid shaped mass measuring 3.7 x 2.5 x 3.8  cm extending off the posterior aspect of the proximal small bowel at the junction of the second and third portions of the duodenum, which measures approximately 49 HU on early phase postcontrast images, increasing to 65 HU on more delayed phase postcontrast imaging, suspicious for a solid neoplasm such as a GI stromal tumor. This is new compared to prior study 06/06/2008.  Extensive atherosclerosis throughout the abdominal and pelvic vasculature, without evidence of aneurysm or dissection. There are numerous colonic diverticulae, particularly in the region of the sigmoid colon, without surrounding inflammatory changes to suggest an acute diverticulitis at this time. Normal appendix. No significant volume of ascites. No pneumoperitoneum. No pathologic distention of small bowel. No definite lymphadenopathy identified within the abdomen or pelvis. In the posterior aspect of the urinary bladder there is a 6 mm calcification, most compatible with a bladder calculus. Prostate gland is unremarkable in appearance.  Musculoskeletal: There are no aggressive appearing lytic or blastic lesions noted in the visualized portions of the skeleton.  IMPRESSION: 1. No definite acute findings to account for the patient's symptoms. 2. However, there is a new 3.7 x 2.5 x 3.8 cm mass extending off the posterior aspect of the duodenum, which has imaging characteristics  suspicious for a solid neoplasm such as a small GI stromal tumor (GIST). Correlation with EGD and biopsy is recommended if clinically appropriate. 3. Extensive colonic diverticulosis without findings to suggest acute diverticulitis at this time. 4. Severe atherosclerosis. 5. 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 6. Severe calcifications of the aortic valve. Echocardiographic correlation for evidence of underlying valvular dysfunction may be warranted if clinically appropriate. 7. 6 mm calculus lying dependently in the urinary bladder. 8. Additional incidental findings, as above.   Electronically Signed   By: Vinnie Langton M.D.   On: 03/24/2013 18:08    EKG:  NSR, LAFB, borderline nonspecific T wave abnormality  ASSESSMENT:  1.  Elevated troponin c/w NSTEMI type 2 from demand ischemia.  He has not had any CP but has had some mild SOB.  His CPK is significantly elevated which I suspect is due to rhabdomyolysis.  He as found down on the ground after unknown period of time. 2.  Acute renal failure with hyperchloremia and acidosis most likely from vomiting and diarrhea.  EKG nonischemic. 3.  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  N/V and diarrhea 5.  COPD 6.  Heart murmur  PLAN:   1.  Continue to cycle cardiac enzymes 2.  Check CPK/MB/RI - I suspect elevated due to rhabdo 3.  2D echo in am to assess LVF and heart murmur 4.  Given advanced age/debilitated state and newly diagnosed duodenal tumor would pursue conservative approach from cardiac standpoint.   5.  Would not use full does anticoagulation at this time since this does not appear to be an acute coronary syndrome especially in the setting of a newly diagnosed duodenal tumor  Sueanne Margarita, MD  03/24/2013  8:17 PM

## 2013-03-24 NOTE — ED Notes (Signed)
Patient transported to CT 

## 2013-03-24 NOTE — ED Notes (Signed)
NOTIFIED DR. WARD IN PERSON OF PATIENTS LAB RESULTS OF  I-STAT TROPONIN ,CG4+ LACTIC ACID ,03/24/2013.

## 2013-03-24 NOTE — H&P (Signed)
[facility] History and Physical  Colin Hudson [id number] DOB: 1918/12/20 DOA: 03/24/2013  Referring physician: ER physician. PCP: [name], MD   Chief Complaint: Was found on the floor at his independent living facility.  HPI: Colin Hudson is a 80 y.o. male with history of glaucoma, COPD, mitral regurgitation was brought to the ER after patient was found to be on the floor as found in his independent living facility. Patient states he has been having diarrhea with nausea and vomiting for the last few days. He was unable to keep anything. He was going to the bathroom when he fell. Patient was found conscious. He was found in the bathroom floor with stools around. Family feels that patient also was very confused initially. In the ER patient had lab works done which shows elevated troponin for which cardiologist was consulted. In addition labs also show hypernatremia, anemia and thrombocytopenia with renal failure. CT head did not show any acute. 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 Patient on exam is nonfocal. Has bruising in multiple areas. Complaints of pain on moving his left hip area. Denies any chest pain or shortness of breath. She states he feels very weak and wants to eat something.   Review of Systems: As presented in the history of presenting illness, rest negative.  Past Medical History  Diagnosis Date  . Pancytopenia     caused by medications he is no longer taking  . Glaucoma   . COPD (chronic obstructive pulmonary disease)   . Mitral regurgitation   . Gout   . Colon polyps   . Cancer     skin  . Shingles   . Syncope   . Onychomycosis     fungal  . Shoulder fracture    Past Surgical History  Procedure Laterality Date  . Tonsillectomy    . Adenoidectomy    . Eye surgery      laser-glaucoma  . Eye surgery      cataract   Social History:  reports that he  has quit smoking. He does not have any smokeless tobacco history on file. He reports that he does not drink alcohol. His drug history is not on file. Where does patient live independent living facility. Can patient participate in ADLs? Not sure.  Allergies  Allergen Reactions  . Aspirin Swelling    Face and eyes    Family History:  Family History  Problem Relation Age of Onset  . CAD Brother       Prior to Admission medications   Medication Sig Start Date End Date Taking? Authorizing Provider  acetaZOLAMIDE (DIAMOX) 250 MG tablet Take 500 mg by mouth 2 (two) times daily.   Yes Historical Provider, MD  pilocarpine (PILOCAR) 4 % ophthalmic solution Place 1 drop into both eyes 4 (four) times daily.   Yes Historical Provider, MD  Polyethyl Glycol-Propyl Glycol (SYSTANE) 0.4-0.3 % SOLN Apply 1 drop to eye daily as needed (for dry eyes).   Yes Historical Provider, MD  travoprost, benzalkonium, (TRAVATAN) 0.004 % ophthalmic solution Place 1 drop into both eyes at bedtime.    Historical Provider, MD    Physical Exam: Filed Vitals:   03/24/13 1600 03/24/13 1725 03/24/13 1945 03/24/13 2030  BP: 155/63 155/61 156/67 164/53  Pulse: 89 79 81 93  Temp:      TempSrc:      Resp: 17 16 21 23   Height:  SpO2: 100% 100% 100% 100%     General:  Well-developed and moderately nourished.  Eyes: Anicteric no pallor.  ENT: No discharge from ears eyes nose or mouth.  Neck: No mass felt.  Cardiovascular: S1-S2 heard. Systolic murmur.  Respiratory: No rhonchi or crepitations.  Abdomen: Soft nontender bowel sounds present. No guarding rigidity.  Skin: Multiple bruises.  Musculoskeletal: Pain on moving left hip area.  Psychiatric: Appears normal.  Neurologic: Alert awake oriented to time place and person. Moves all extremities.  Labs on Admission:  Basic Metabolic Panel:  Recent Labs Lab 03/24/13 1619  NA 148*  K 3.8  CL 115*  CO2 16*  GLUCOSE 105*  BUN 43*  CREATININE  1.38*  CALCIUM 9.1   Liver Function Tests:  Recent Labs Lab 03/24/13 1619  AST 106*  ALT 36  ALKPHOS 105  BILITOT 0.3  PROT 7.8  ALBUMIN 3.7    Recent Labs Lab 03/24/13 1619  LIPASE 14   No results found for this basename: AMMONIA,  in the last 168 hours CBC:  Recent Labs Lab 03/24/13 1619  WBC 10.2  NEUTROABS 6.9  HGB 12.7*  HCT 37.9*  MCV 96.4  PLT 97*   Cardiac Enzymes:  Recent Labs Lab 03/24/13 1725  CKTOTAL 1893*  TROPONINI 1.12*    BNP (last 3 results) No results found for this basename: PROBNP,  in the last 8760 hours CBG: No results found for this basename: GLUCAP,  in the last 168 hours  Radiological Exams on Admission: Dg Chest 2 View  03/24/2013   CLINICAL DATA:  Altered mental status.  Shortness of breath.  EXAM: CHEST  2 VIEW  COMPARISON:  CT chest 09/16/2012 and PA and lateral chest 12/11/2012.  FINDINGS: Calcified pleural plaques are again seen. The lungs are clear. Heart size is normal. No pneumothorax or pleural effusion.  IMPRESSION: No acute finding.  Stable compared to prior exams.   Electronically Signed   By: Inge Rise M.D.   On: 03/24/2013 16:39   Ct Head Wo Contrast  03/24/2013   CLINICAL DATA:  Fall, head trauma  EXAM: CT HEAD WITHOUT CONTRAST  TECHNIQUE: Contiguous axial images were obtained from the base of the skull through the vertex without intravenous contrast.  COMPARISON:  None.  FINDINGS: No intracranial hemorrhage. No parenchymal contusion. No midline shift or mass effect. Basilar cisterns are patent. No skull base fracture. No fluid in the paranasal sinuses or mastoid air cells. There is extra-axial fluid collection anterior to the right frontal lobe is low-density most suggestive of a chronic hygroma. No midline shift or mass effect from this fluid collection.  Visualized cortical atrophy. There is periventricular subcortical white matter hypodensities.  No evidence of skull fracture. No fluid the paranasal sinuses or  mastoid air cells.  IMPRESSION: 1. No intracranial trauma. 2. A chronic low-density extra-axial fluid collection anterior to the right frontal lobe likely represents hygroma. 3. Atrophy and microvascular disease. 4. No skull fracture.   Electronically Signed   By: Suzy Bouchard M.D.   On: 03/24/2013 17:59   Ct Abdomen Pelvis W Contrast  03/24/2013   CLINICAL DATA:  Diarrhea and abdominal pain.  EXAM: CT ABDOMEN AND PELVIS WITH CONTRAST  TECHNIQUE: Multidetector CT imaging of the abdomen and pelvis was performed using the standard protocol following bolus administration of intravenous contrast.  CONTRAST:  69mL OMNIPAQUE IOHEXOL 300 MG/ML  SOLN  COMPARISON:  CT of the abdomen and pelvis 06/06/2008.  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 Severe calcifications of the aortic valve. Atherosclerotic calcifications throughout the right coronary artery.  Abdomen/Pelvis: The appearance of the liver, gallbladder, pancreas, spleen and bilateral adrenal glands is unremarkable. Sub cm low-attenuation lesions in the left kidney are too small to definitively characterize. Mild atrophy of the right kidney. There are 2 simple cysts in the right kidney, largest of which measures 2.6 cm in diameter extending exophytically off the posterior aspect of the lower pole.  Image 38 of series 3 demonstrates a well-circumscribed ovoid shaped mass measuring 3.7 x 2.5 x 3.8 cm extending off the posterior aspect of the proximal small bowel at the junction of the second and third portions of the duodenum, which measures approximately 49 HU on early phase postcontrast images, increasing to 65 HU on more delayed phase postcontrast imaging, suspicious for a solid neoplasm such as a GI stromal tumor. This is new compared to prior study 06/06/2008.  Extensive atherosclerosis throughout the abdominal and pelvic vasculature, without evidence of  aneurysm or dissection. There are numerous colonic diverticulae, particularly in the region of the sigmoid colon, without surrounding inflammatory changes to suggest an acute diverticulitis at this time. Normal appendix. No significant volume of ascites. No pneumoperitoneum. No pathologic distention of small bowel. No definite lymphadenopathy identified within the abdomen or pelvis. In the posterior aspect of the urinary bladder there is a 6 mm calcification, most compatible with a bladder calculus. Prostate gland is unremarkable in appearance.  Musculoskeletal: There are no aggressive appearing lytic or blastic lesions noted in the visualized portions of the skeleton.  IMPRESSION: 1. No definite acute findings to account for the patient's symptoms. 2. However, there is a new 3.7 x 2.5 x 3.8 cm mass extending off the posterior aspect of the duodenum, which has imaging characteristics suspicious for a solid neoplasm such as a small GI stromal tumor (GIST). Correlation with EGD and biopsy is recommended if clinically appropriate. 3. Extensive colonic diverticulosis without findings to suggest acute diverticulitis at this time. 4. Severe atherosclerosis. 5. 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 6. Severe calcifications of the aortic valve. Echocardiographic correlation for evidence of underlying valvular dysfunction may be warranted if clinically appropriate. 7. 6 mm calculus lying dependently in the urinary bladder. 8. Additional incidental findings, as above.   Electronically Signed   By: Vinnie Langton M.D.   On: 03/24/2013 18:08    EKG: Independently reviewed. Normal sinus rhythm with nonspecific T-wave changes.  Assessment/Plan Principal Problem:   Near syncope Active Problems:   NSTEMI (non-ST elevated myocardial infarction)   Thrombocytopenia   Anemia   Abdominal mass   Renal failure   Metabolic acidosis    Hypernatremia   Nausea vomiting and diarrhea   1. Near syncopal episode - probably preceded by nausea vomiting diarrhea causing dehydration. Patient has loud systolic murmur. Records states patient has mitral regurgitation but patient's family does not recall. CAT scan shows aortic valve calcification concerning for aortic stenosis. Check 2-D echo. Since patient's troponins are elevated check LV function and cycle cardiac markers and observe in telemetry for arrhythmias. Check MRI brain as patient's family feels that he had mild confusion and had slurred speech and until then patient will be on neurochecks. 2. Non-ST elevation MI - cardiology on board. Cardiology feels it could be demand ischemia. Patient has thrombocytopenia and is allergic to aspirin. Cycle cardiac markers. Patient is chest pain-free. As per  cardiology recommendations patient with known were full dose heparin for now. 3. Abdominal mass - concerning for GI stromal tumor. Will need further workup with referral to GI or surgeon. 4. Nausea vomiting and diarrhea - check stool studies. Patient states he has not taken any new antibiotics. 5. Renal failure - probably acute. Given hypernatremia could be from dehydration. UA only shows hyaline casts. Gently hydrate and closely follow intake output. 6. Hypernatremia - probably from dehydration. I have placed patient on D5W. Closely follow metabolic panel and intake output. 7. Anemia and thrombocytopenia - patient's charts state he has had pancytopenia previous from a drug. Closely follow CBC. I have ordered anemia panel. 8. Possible rhabdomyolysis - closely follow CK levels. Gently hydrate. 9. Metabolic acidosis - gently hydrate and follow metabolic panel intake output.  Since patient has anemia with thrombocytopenia and renal failure I have ordered LDH smear study (to check for schistocytes) and haptoglobin to check for any hemolytic process, like TTP. But patient is not febrile. Patient's  daughter states she is bringing patient's lab works done last month tomorrow morning. Please compare these labs and labs ordered to make further recommendations.   Code Status:  DO NOT RESUSCITATE.  Family Communication:  Patient's daughter at the bedside.  Disposition Plan:  Admit to inpatient.    Alcus Bradly N. [facility] Pager [phone].  If 7PM-7AM, please contact night-coverage [email or web address] Password [facility] 03/24/2013, 8:58 PM

## 2013-03-25 ENCOUNTER — Inpatient Hospital Stay (HOSPITAL_COMMUNITY): Payer: Medicare Other

## 2013-03-25 DIAGNOSIS — I359 Nonrheumatic aortic valve disorder, unspecified: Secondary | ICD-10-CM

## 2013-03-25 DIAGNOSIS — E860 Dehydration: Secondary | ICD-10-CM

## 2013-03-25 DIAGNOSIS — R4182 Altered mental status, unspecified: Secondary | ICD-10-CM

## 2013-03-25 DIAGNOSIS — R1900 Intra-abdominal and pelvic swelling, mass and lump, unspecified site: Secondary | ICD-10-CM

## 2013-03-25 DIAGNOSIS — D649 Anemia, unspecified: Secondary | ICD-10-CM

## 2013-03-25 LAB — COMPREHENSIVE METABOLIC PANEL
ALK PHOS: 85 U/L (ref 39–117)
ALT: 32 U/L (ref 0–53)
AST: 83 U/L — AB (ref 0–37)
Albumin: 3.1 g/dL — ABNORMAL LOW (ref 3.5–5.2)
BILIRUBIN TOTAL: 0.3 mg/dL (ref 0.3–1.2)
BUN: 37 mg/dL — ABNORMAL HIGH (ref 6–23)
CHLORIDE: 113 meq/L — AB (ref 96–112)
CO2: 15 mEq/L — ABNORMAL LOW (ref 19–32)
Calcium: 8.3 mg/dL — ABNORMAL LOW (ref 8.4–10.5)
Creatinine, Ser: 1.27 mg/dL (ref 0.50–1.35)
GFR calc Af Amer: 55 mL/min — ABNORMAL LOW (ref >90)
GFR calc non Af Amer: 47 mL/min — ABNORMAL LOW (ref >90)
Glucose, Bld: 103 mg/dL — ABNORMAL HIGH (ref 70–99)
POTASSIUM: 3.9 meq/L (ref 3.7–5.3)
Sodium: 141 mEq/L (ref 137–147)
Total Protein: 6.7 g/dL (ref 6.0–8.3)

## 2013-03-25 LAB — CBC WITH DIFFERENTIAL/PLATELET
BASOS ABS: 0 10*3/uL (ref 0.0–0.1)
Basophils Relative: 0 % (ref 0–1)
EOS PCT: 0 % (ref 0–5)
Eosinophils Absolute: 0 10*3/uL (ref 0.0–0.7)
HCT: 32.9 % — ABNORMAL LOW (ref 39.0–52.0)
Hemoglobin: 11 g/dL — ABNORMAL LOW (ref 13.0–17.0)
Lymphocytes Relative: 8 % — ABNORMAL LOW (ref 12–46)
Lymphs Abs: 0.7 10*3/uL (ref 0.7–4.0)
MCH: 32.4 pg (ref 26.0–34.0)
MCHC: 33.4 g/dL (ref 30.0–36.0)
MCV: 96.8 fL (ref 78.0–100.0)
MONO ABS: 2.3 10*3/uL — AB (ref 0.1–1.0)
Monocytes Relative: 25 % — ABNORMAL HIGH (ref 3–12)
Neutro Abs: 6.3 10*3/uL (ref 1.7–7.7)
Neutrophils Relative %: 67 % (ref 43–77)
PLATELETS: 85 10*3/uL — AB (ref 150–400)
RBC: 3.4 MIL/uL — ABNORMAL LOW (ref 4.22–5.81)
RDW: 15.1 % (ref 11.5–15.5)
WBC: 9.3 10*3/uL (ref 4.0–10.5)

## 2013-03-25 LAB — FOLATE: Folate: 15.7 ng/mL

## 2013-03-25 LAB — LIPID PANEL
CHOLESTEROL: 122 mg/dL (ref 0–200)
HDL: 41 mg/dL (ref >39)
LDL Cholesterol: 69 mg/dL (ref 0–99)
Total CHOL/HDL Ratio: 3 RATIO
Triglycerides: 58 mg/dL (ref <150)
VLDL: 12 mg/dL (ref 0–40)

## 2013-03-25 LAB — HAPTOGLOBIN: Haptoglobin: 34 mg/dL — ABNORMAL LOW (ref 45–215)

## 2013-03-25 LAB — HEMOGLOBIN A1C
Hgb A1c MFr Bld: 5.8 % — ABNORMAL HIGH (ref <5.7)
Mean Plasma Glucose: 120 mg/dL — ABNORMAL HIGH (ref <117)

## 2013-03-25 LAB — CK: Total CK: 1170 U/L — ABNORMAL HIGH (ref 7–232)

## 2013-03-25 LAB — IRON AND TIBC
Iron: 39 ug/dL — ABNORMAL LOW (ref 42–135)
Saturation Ratios: 18 % — ABNORMAL LOW (ref 20–55)
TIBC: 216 ug/dL (ref 215–435)
UIBC: 177 ug/dL (ref 125–400)

## 2013-03-25 LAB — VITAMIN B12: Vitamin B-12: 1053 pg/mL — ABNORMAL HIGH (ref 211–911)

## 2013-03-25 LAB — TSH: TSH: 2.456 u[IU]/mL (ref 0.350–4.500)

## 2013-03-25 LAB — TROPONIN I
Troponin I: 0.96 ng/mL (ref <0.30)
Troponin I: 0.95 ng/mL (ref <0.30)
Troponin I: 0.76 ng/mL (ref <0.30)

## 2013-03-25 LAB — FERRITIN: FERRITIN: 281 ng/mL (ref 22–322)

## 2013-03-25 MED ORDER — ATORVASTATIN CALCIUM 10 MG PO TABS
10.0000 mg | ORAL_TABLET | Freq: Every day | ORAL | Status: DC
  Administered 2013-03-26 – 2013-03-28 (×3): 10 mg via ORAL
  Filled 2013-03-25 (×4): qty 1

## 2013-03-25 MED ORDER — BIOTENE DRY MOUTH MT LIQD
15.0000 mL | Freq: Two times a day (BID) | OROMUCOSAL | Status: DC
  Administered 2013-03-25 – 2013-03-28 (×6): 15 mL via OROMUCOSAL

## 2013-03-25 MED ORDER — METOPROLOL TARTRATE 12.5 MG HALF TABLET
12.5000 mg | ORAL_TABLET | Freq: Two times a day (BID) | ORAL | Status: DC
  Administered 2013-03-25: 12.5 mg via ORAL
  Filled 2013-03-25 (×4): qty 1

## 2013-03-25 MED ORDER — STROKE: EARLY STAGES OF RECOVERY BOOK
Freq: Once | Status: AC
  Administered 2013-03-25: 08:00:00
  Filled 2013-03-25: qty 1

## 2013-03-25 NOTE — Progress Notes (Signed)
UR Completed Colin Codispoti Graves-Bigelow, RN,BSN [phone]  

## 2013-03-25 NOTE — Progress Notes (Signed)
Patient ID: Colin Hudson  male  KVQ:[id number]    DOB: Jun 24, 1918    DOA: 03/24/2013  PCP: [name], MD  Assessment/Plan: Principal Problem:   Near syncope/ NSTEMI - Cardiology following, troponins trending down now - Per cardiology, suspect noncardiac origin of mild troponin abnormality, I recommended 2-D echocardiogram shows normal regional wall motion, no further cardiac workup  Active Problems:    Thrombocytopenia/  Anemia - Continue to monitor counts, no active bleeding, platelets trending down    Abdominal mass - Incidentally found on the CT abdomen, 3.7x 2.5 and 3.8 cm mass extending off the posterior aspect of the duodenum, suspicious for solid neoplasm recommending EGD and biopsy - Discussed in detail with the patient who does not want to pursue any further management, investigation or any surgery. Patient states that he is 80 year old and at this time does not wish to pursue any further management. He did not allow me to discuss with his family. I have called for palliative consult for goals of care.      Renal failure : Improving     Hypernatremia: Improving     Nausea vomiting and diarrhea: Improving   DVT Prophylaxis:  Code Status: DO NOT RESUSCITATE   Family Communication: discussed in detail with the patient   Disposition:Will likely need skilled nursing facility    Subjective: Denies any specific complaints at this time no chest pain or shortness of breath, dizziness or lightheadedness  Objective: Weight change:   Intake/Output Summary (Last 24 hours) at 03/25/13 1308 Last data filed at 03/25/13 0700  Gross per 24 hour  Intake 311.25 ml  Output    375 ml  Net -63.75 ml   Blood pressure 149/55, pulse 77, temperature 97.7 F (36.5 C), temperature source Oral, resp. rate 16, height 5' 11.5" (1.816 m), weight 78.5 kg (173 lb 1 oz), SpO2 100.00%.  Physical Exam: General: Alert and awake, oriented x3, not in any acute distress. CVS: S1-S2 clear,  SEM Chest: CTAB Abdomen: soft nontender, nondistended, normal bowel sounds  Extremities: no cyanosis, clubbing or edema noted bilaterally Neuro: Cranial nerves II-XII intact, no focal neurological deficits  Lab Results: Basic Metabolic Panel:  Recent Labs Lab 03/24/13 1619 03/25/13 0545  NA 148* 141  K 3.8 3.9  CL 115* 113*  CO2 16* 15*  GLUCOSE 105* 103*  BUN 43* 37*  CREATININE 1.38* 1.27  CALCIUM 9.1 8.3*   Liver Function Tests:  Recent Labs Lab 03/24/13 1619 03/25/13 0545  AST 106* 83*  ALT 36 32  ALKPHOS 105 85  BILITOT 0.3 0.3  PROT 7.8 6.7  ALBUMIN 3.7 3.1*    Recent Labs Lab 03/24/13 1619  LIPASE 14   No results found for this basename: AMMONIA,  in the last 168 hours CBC:  Recent Labs Lab 03/24/13 1619 03/25/13 0545  WBC 10.2 9.3  NEUTROABS 6.9 6.3  HGB 12.7* 11.0*  HCT 37.9* 32.9*  MCV 96.4 96.8  PLT 97* 85*   Cardiac Enzymes:  Recent Labs Lab 03/24/13 1725 03/24/13 2250 03/25/13 0542 03/25/13 0545 03/25/13 0946  CKTOTAL 1893*  --   --  1170*  --   TROPONINI 1.12* 0.96* 0.95*  --  0.76*   BNP: No components found with this basename: POCBNP,  CBG: No results found for this basename: GLUCAP,  in the last 168 hours   Micro Results: No results found for this or any previous visit (from the past 240 hour(s)).  Studies/Results: Dg Chest 2 View  03/24/2013  CLINICAL DATA:  Altered mental status.  Shortness of breath.  EXAM: CHEST  2 VIEW  COMPARISON:  CT chest 09/16/2012 and PA and lateral chest 12/11/2012.  FINDINGS: Calcified pleural plaques are again seen. The lungs are clear. Heart size is normal. No pneumothorax or pleural effusion.  IMPRESSION: No acute finding.  Stable compared to prior exams.   Electronically Signed   By: Inge Rise M.D.   On: 03/24/2013 16:39   Ct Head Wo Contrast  03/24/2013   CLINICAL DATA:  Fall, head trauma  EXAM: CT HEAD WITHOUT CONTRAST  TECHNIQUE: Contiguous axial images were obtained from the  base of the skull through the vertex without intravenous contrast.  COMPARISON:  None.  FINDINGS: No intracranial hemorrhage. No parenchymal contusion. No midline shift or mass effect. Basilar cisterns are patent. No skull base fracture. No fluid in the paranasal sinuses or mastoid air cells. There is extra-axial fluid collection anterior to the right frontal lobe is low-density most suggestive of a chronic hygroma. No midline shift or mass effect from this fluid collection.  Visualized cortical atrophy. There is periventricular subcortical white matter hypodensities.  No evidence of skull fracture. No fluid the paranasal sinuses or mastoid air cells.  IMPRESSION: 1. No intracranial trauma. 2. A chronic low-density extra-axial fluid collection anterior to the right frontal lobe likely represents hygroma. 3. Atrophy and microvascular disease. 4. No skull fracture.   Electronically Signed   By: Suzy Bouchard M.D.   On: 03/24/2013 17:59   Ct Abdomen Pelvis W Contrast  03/24/2013   CLINICAL DATA:  Diarrhea and abdominal pain.  EXAM: CT ABDOMEN AND PELVIS WITH CONTRAST  TECHNIQUE: Multidetector CT imaging of the abdomen and pelvis was performed using the standard protocol following bolus administration of intravenous contrast.  CONTRAST:  72mL OMNIPAQUE IOHEXOL 300 MG/ML  SOLN  COMPARISON:  CT of the abdomen and pelvis 06/06/2008.  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 Severe calcifications of the aortic valve. Atherosclerotic calcifications throughout the right coronary artery.  Abdomen/Pelvis: The appearance of the liver, gallbladder, pancreas, spleen and bilateral adrenal glands is unremarkable. Sub cm low-attenuation lesions in the left kidney are too small to definitively characterize. Mild atrophy of the right kidney. There are 2 simple cysts in the right kidney, largest of which measures 2.6 cm in diameter  extending exophytically off the posterior aspect of the lower pole.  Image 38 of series 3 demonstrates a well-circumscribed ovoid shaped mass measuring 3.7 x 2.5 x 3.8 cm extending off the posterior aspect of the proximal small bowel at the junction of the second and third portions of the duodenum, which measures approximately 49 HU on early phase postcontrast images, increasing to 65 HU on more delayed phase postcontrast imaging, suspicious for a solid neoplasm such as a GI stromal tumor. This is new compared to prior study 06/06/2008.  Extensive atherosclerosis throughout the abdominal and pelvic vasculature, without evidence of aneurysm or dissection. There are numerous colonic diverticulae, particularly in the region of the sigmoid colon, without surrounding inflammatory changes to suggest an acute diverticulitis at this time. Normal appendix. No significant volume of ascites. No pneumoperitoneum. No pathologic distention of small bowel. No definite lymphadenopathy identified within the abdomen or pelvis. In the posterior aspect of the urinary bladder there is a 6 mm calcification, most compatible with a bladder calculus. Prostate gland is unremarkable in appearance.  Musculoskeletal: There are no aggressive appearing lytic or blastic  lesions noted in the visualized portions of the skeleton.  IMPRESSION: 1. No definite acute findings to account for the patient's symptoms. 2. However, there is a new 3.7 x 2.5 x 3.8 cm mass extending off the posterior aspect of the duodenum, which has imaging characteristics suspicious for a solid neoplasm such as a small GI stromal tumor (GIST). Correlation with EGD and biopsy is recommended if clinically appropriate. 3. Extensive colonic diverticulosis without findings to suggest acute diverticulitis at this time. 4. Severe atherosclerosis. 5. 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 6. Severe  calcifications of the aortic valve. Echocardiographic correlation for evidence of underlying valvular dysfunction may be warranted if clinically appropriate. 7. 6 mm calculus lying dependently in the urinary bladder. 8. Additional incidental findings, as above.   Electronically Signed   By: Vinnie Langton M.D.   On: 03/24/2013 18:08   Dg Pelvis Portable  03/25/2013   CLINICAL DATA:  Left hip pain  EXAM: PORTABLE PELVIS 1-2 VIEWS  COMPARISON:  CT ABD/PELVIS W CM dated 03/24/2013  FINDINGS: Hips are located. No evidence of pelvic fracture or sacral fracture.  IMPRESSION: No evidence of hip fracture.   Electronically Signed   By: Suzy Bouchard M.D.   On: 03/25/2013 00:10    Medications: Scheduled Meds: . antiseptic oral rinse  15 mL Mouth Rinse BID  . atorvastatin  10 mg Oral q1800  . latanoprost  1 drop Both Eyes QHS  . metoprolol tartrate  12.5 mg Oral BID  . pilocarpine  1 drop Both Eyes QID  . Tdap  0.5 mL Intramuscular Once      LOS: 1 day   Steen Bisig M.D. [facility] 03/25/2013, 1:08 PM Pager: [phone]  If 7PM-7AM, please contact night-coverage [email or web address] Password [email or web address]

## 2013-03-25 NOTE — Evaluation (Signed)
Physical Therapy Evaluation Patient Details Name: Colin Hudson MRN: [id number] DOB: 13-Apr-1918 Today's Date: 03/25/2013 Time: 9371-6967 PT Time Calculation (min): 27 min  PT Assessment / Plan / Recommendation History of Present Illness  80 y.o. male admitted to [facility] on 03/24/13 with near syncope, fall, NSTEMI (per cards demand ischemia), thrombocytopenia/anemia, abdominal mass.  Pt with PMHx of skin CA, COPD (with baseline DOE per pt report), glaucoma, and gout.    Clinical Impression  The pt is very deconditioned and more unsteady on his feet than his normal baseline.  At this point he is unable to function in an independent apartment and will need short SNF for rehab stay to get back to that level again.   PT to follow acutely for deficits listed below.       PT Assessment  Patient needs continued PT services    Follow Up Recommendations  SNF    Does the patient have the potential to tolerate intense rehabilitation     NA  Barriers to Discharge   None      Equipment Recommendations  Rolling walker with 5" wheels    Recommendations for Other Services   None  Frequency Min 2X/week    Precautions / Restrictions Precautions Precautions: Fall   Pertinent Vitals/Pain DOE 2/4 during gait.  Unable to get O2 sats on portable monitor (likely due to cold fingers).  Per pt report this level of DOE is normal for him.       Mobility  Bed Mobility Overal bed mobility: Needs Assistance Bed Mobility: Supine to Sit Supine to sit: Min assist General bed mobility comments: min assist to support trunk during transitions to prevent posterior LOB until pt can get his feet on the ground.   Transfers Overall transfer level: Needs assistance Transfers: Sit to/from Stand Sit to Stand: Mod assist General transfer comment: mod assist to support trunk over weak legs and pt using momentum to get onto his feet.  Verbal cues for safe hand placement.  Ambulation/Gait Ambulation/Gait assistance:  Min assist Ambulation Distance (Feet): 150 Feet Assistive device: Rolling walker (2 wheeled) Gait Pattern/deviations: Step-through pattern;Shuffle;Trunk flexed Gait velocity: decreased Gait velocity interpretation: <1.8 ft/sec, indicative of risk for recurrent falls General Gait Details: pt with slow, shuffling steps, relying now on the RW to help support him for balance and support his trunk over weak legs.          PT Diagnosis: Difficulty walking;Abnormality of gait;Generalized weakness  PT Problem List: Decreased strength;Decreased activity tolerance;Decreased balance;Decreased mobility;Decreased knowledge of use of DME;Cardiopulmonary status limiting activity PT Treatment Interventions: DME instruction;Gait training;Functional mobility training;Therapeutic activities;Balance training;Therapeutic exercise;Neuromuscular re-education;Patient/family education     PT Goals(Current goals can be found in the care plan section) Acute Rehab PT Goals Patient Stated Goal: to get stronger and go back to his apartment.  PT Goal Formulation: With patient Time For Goal Achievement: 04/08/13 Potential to Achieve Goals: Good  Visit Information  Last PT Received On: 03/25/13 Assistance Needed: +1 History of Present Illness: 80 y.o. male admitted to [facility] on 03/24/13 with near syncope, fall, NSTEMI (per cards demand ischemia), thrombocytopenia/anemia, abdominal mass.  Pt with PMHx of skin CA, COPD (with baseline DOE per pt report), glaucoma, and gout.         Prior [place] expects to be discharged to:: Private residence Living Arrangements: Alone Available Help at Discharge: Personal care attendant;Family;Available PRN/intermittently Type of Home: Independent living facility ([facility] [place] independent living apartment) Home Access: Level entry Home Layout:  One level Home Equipment: None Prior Function Level of Independence: Independent Comments: Per pt he has  stopped driving, but his daughter drives him where he wants to go.  He normally walks without an assistive device and likes to read (although this is now limited due to his glaucoma).   Communication Communication: HOH    Cognition  Cognition Arousal/Alertness: Awake/alert Behavior During Therapy: WFL for tasks assessed/performed Overall Cognitive Status: Within Functional Limits for tasks assessed (appears WFL, although have to reapet yourself)    Extremity/Trunk Assessment Upper Extremity Assessment Upper Extremity Assessment: Defer to OT evaluation Lower Extremity Assessment Lower Extremity Assessment: Generalized weakness Cervical / Trunk Assessment Cervical / Trunk Assessment: Normal   Balance Balance Overall balance assessment: Needs assistance Sitting-balance support: Bilateral upper extremity supported;Feet supported Sitting balance-Leahy Scale: Good Standing balance support: Bilateral upper extremity supported Standing balance-Leahy Scale: Poor General Comments General comments (skin integrity, edema, etc.): Pt with 2/4 DOE with gait, unable to get a pulse ox reading likely due to cold hands.  Pt reports DOE is his baseline due to COPD and long standing h/o smoking when he was younger.  He does not use O2 at home.   End of Session PT - End of Session Equipment Utilized During Treatment: Gait belt Activity Tolerance: Patient limited by fatigue Patient left: in chair;with call bell/phone within reach;with chair alarm set Nurse Communication: Mobility status    Wells Guiles B. Dutton, [place], DPT [phone]   03/25/2013, 2:04 PM

## 2013-03-25 NOTE — Progress Notes (Signed)
OT Cancellation Note  Patient Details Name: Colin Hudson MRN: [id number] DOB: May 30, 1918   Cancelled Treatment:    Reason Eval/Treat Not Completed: Other (comment) Pt to D/C to SNF. Defer OT to SNF. If eval needed for D/C, please call [phone] or page number below. Thank you. [place], OTR/L  [phone] 03/25/2013 03/25/2013, 6:52 PM

## 2013-03-25 NOTE — Progress Notes (Signed)
Patient Name: Natale Thoma Date of Encounter: 03/25/2013   Principal Problem:   Near syncope Active Problems:   Nausea vomiting and diarrhea   NSTEMI (non-ST elevated myocardial infarction)   Anemia   Abdominal mass   Renal failure   Metabolic acidosis   Thrombocytopenia   Hypernatremia    SUBJECTIVE  Very groggy this morning.  No chest pain or sob.  CURRENT MEDS . antiseptic oral rinse  15 mL Mouth Rinse BID  . latanoprost  1 drop Both Eyes QHS  . pilocarpine  1 drop Both Eyes QID  . Tdap  0.5 mL Intramuscular Once    OBJECTIVE  Filed Vitals:   03/25/13 0400 03/25/13 0600 03/25/13 0800 03/25/13 1000  BP: 150/50 136/87 167/68 149/55  Pulse: 62 85 87 77  Temp: 97.5 F (36.4 C)  98.2 F (36.8 C) 97.7 F (36.5 C)  TempSrc: Oral  Oral Oral  Resp: 18 16 18 16   Height:      Weight:      SpO2: 98% 99% 100% 100%    Intake/Output Summary (Last 24 hours) at 03/25/13 1102 Last data filed at 03/25/13 0700  Gross per 24 hour  Intake 311.25 ml  Output    375 ml  Net -63.75 ml   Filed Weights   03/24/13 2149  Weight: 173 lb 1 oz (78.5 kg)    PHYSICAL EXAM  General: Pleasant, NAD.  Very groggy. Neuro: Alert and oriented X 3. Moves all extremities spontaneously. Psych: Flat affect. HEENT:  Normal  Neck: Supple without bruits or JVD. Lungs:  Resp regular and unlabored, CTA. Heart: RRR no s3, s4, 2/6 SEM RUSB, 3/6 syst murmur @ apex. Abdomen: Soft, non-tender, non-distended, BS + x 4.  Extremities: No clubbing, cyanosis or edema. DP/PT/Radials 2+ and equal bilaterally.  Accessory Clinical Findings  CBC  Recent Labs  03/24/13 1619 03/25/13 0545  WBC 10.2 9.3  NEUTROABS 6.9 6.3  HGB 12.7* 11.0*  HCT 37.9* 32.9*  MCV 96.4 96.8  PLT 97* 85*   Basic Metabolic Panel  Recent Labs  03/24/13 1619 03/25/13 0545  NA 148* 141  K 3.8 3.9  CL 115* 113*  CO2 16* 15*  GLUCOSE 105* 103*  BUN 43* 37*  CREATININE 1.38* 1.27  CALCIUM 9.1 8.3*   Liver  Function Tests  Recent Labs  03/24/13 1619 03/25/13 0545  AST 106* 83*  ALT 36 32  ALKPHOS 105 85  BILITOT 0.3 0.3  PROT 7.8 6.7  ALBUMIN 3.7 3.1*    Recent Labs  03/24/13 1619  LIPASE 14   Cardiac Enzymes  Recent Labs  03/24/13 1725 03/24/13 2250 03/25/13 0542 03/25/13 0545 03/25/13 0946  CKTOTAL 1893*  --   --  1170*  --   TROPONINI 1.12* 0.96* 0.95*  --  0.76*   Fasting Lipid Panel  Recent Labs  03/25/13 0545  CHOL 122  HDL 41  LDLCALC 69  TRIG 58  CHOLHDL 3.0   Thyroid Function Tests  Recent Labs  03/24/13 2250  TSH 2.456    TELE  Rsr, pvc's.  ECG  Rsr, 84, lad/lafb  Radiology/Studies  Dg Pelvis Portable  03/25/2013   CLINICAL DATA:  Left hip pain  EXAM: PORTABLE PELVIS 1-2 VIEWS  COMPARISON:  CT ABD/PELVIS W CM dated 03/24/2013  FINDINGS: Hips are located. No evidence of pelvic fracture or sacral fracture.  IMPRESSION: No evidence of hip fracture.   Electronically Signed   By: Suzy Bouchard M.D.   On: 03/25/2013  00:10   ASSESSMENT AND PLAN  1.  Duodenal Mass/N/V/D:  N/V/D improved.  Mgmt of mass per IM/GI.  2.  Type II NSTEMI:  No chest pain.  Relatively flat troponin trend.  CK elevated in setting of presumed rhabdo.  Echo today.  Cont conservative mgmt - no anticoagulation in setting of duod mass.  Will consider ASA depending on further w/u of #1.  Will add low-dose bb.  LDL 69.  Add low-dose statin (AST coming down).  3.  Presyncope:  In setting of n/v/d.  No pauses/significant ectopy on tele.  Echo today.    Signed, Murray Hodgkins NP  I have seen and examined the patient along with Murray Hodgkins NP.  I have reviewed the chart, notes and new data.  I agree with NP's note.  Key new complaints: becoming gradually more alert Key examination changes: no sign of CHF, no arrhythmia x rare PVCs Key new findings / data: cTrop I raised in plateau fashion  Echo pending Calcium in RCA distribution noted  PLAN: Suspect  non-cardiac origin of mild troponin abnormality. If echo shows normal regional wall motion, no other cardiac workup planned at this time.  Sanda Klein, MD, [place] [phone] 03/25/2013, 1:01 PM

## 2013-03-26 LAB — CBC
HEMATOCRIT: 31.2 % — AB (ref 39.0–52.0)
HEMOGLOBIN: 10.6 g/dL — AB (ref 13.0–17.0)
MCH: 32.6 pg (ref 26.0–34.0)
MCHC: 34 g/dL (ref 30.0–36.0)
MCV: 96 fL (ref 78.0–100.0)
Platelets: 76 10*3/uL — ABNORMAL LOW (ref 150–400)
RBC: 3.25 MIL/uL — AB (ref 4.22–5.81)
RDW: 14.8 % (ref 11.5–15.5)
WBC: 7.3 10*3/uL (ref 4.0–10.5)

## 2013-03-26 LAB — BASIC METABOLIC PANEL
BUN: 28 mg/dL — ABNORMAL HIGH (ref 6–23)
CHLORIDE: 109 meq/L (ref 96–112)
CO2: 21 mEq/L (ref 19–32)
Calcium: 8.5 mg/dL (ref 8.4–10.5)
Creatinine, Ser: 1.27 mg/dL (ref 0.50–1.35)
GFR, EST AFRICAN AMERICAN: 55 mL/min — AB (ref >90)
GFR, EST NON AFRICAN AMERICAN: 47 mL/min — AB (ref >90)
Glucose, Bld: 98 mg/dL (ref 70–99)
POTASSIUM: 3.5 meq/L — AB (ref 3.7–5.3)
SODIUM: 142 meq/L (ref 137–147)

## 2013-03-26 MED ORDER — CLOPIDOGREL BISULFATE 75 MG PO TABS
75.0000 mg | ORAL_TABLET | Freq: Every day | ORAL | Status: DC
  Administered 2013-03-26 – 2013-03-28 (×3): 75 mg via ORAL
  Filled 2013-03-26 (×3): qty 1

## 2013-03-26 MED ORDER — LOSARTAN POTASSIUM 25 MG PO TABS
25.0000 mg | ORAL_TABLET | Freq: Every day | ORAL | Status: DC
  Administered 2013-03-26 – 2013-03-28 (×3): 25 mg via ORAL
  Filled 2013-03-26 (×3): qty 1

## 2013-03-26 MED ORDER — METOPROLOL TARTRATE 25 MG PO TABS
25.0000 mg | ORAL_TABLET | Freq: Two times a day (BID) | ORAL | Status: DC
  Administered 2013-03-26 – 2013-03-28 (×5): 25 mg via ORAL
  Filled 2013-03-26 (×6): qty 1

## 2013-03-26 MED ORDER — TAMSULOSIN HCL 0.4 MG PO CAPS
0.4000 mg | ORAL_CAPSULE | Freq: Every day | ORAL | Status: DC
  Administered 2013-03-26 – 2013-03-28 (×3): 0.4 mg via ORAL
  Filled 2013-03-26 (×3): qty 1

## 2013-03-26 MED ORDER — DIPHENHYDRAMINE HCL 25 MG PO CAPS: 25.0000 mg | ORAL_CAPSULE | Freq: Every evening | ORAL | Status: DC | PRN

## 2013-03-26 MED ORDER — ZOLPIDEM TARTRATE 5 MG PO TABS: 5.0000 mg | ORAL_TABLET | Freq: Every evening | ORAL | Status: DC | PRN

## 2013-03-26 NOTE — Evaluation (Signed)
Speech Language Pathology Evaluation Patient Details Name: Colin Hudson MRN: [id number] DOB: 1918/03/15 Today's Date: 03/26/2013 Time: 3419-3790 SLP Time Calculation (min): 38 min  Problem List:  Patient Active Problem List   Diagnosis Date Noted  . NSTEMI (non-ST elevated myocardial infarction) 03/24/2013  . Near syncope 03/24/2013  . Thrombocytopenia 03/24/2013  . Anemia 03/24/2013  . Abdominal mass 03/24/2013  . Renal failure 03/24/2013  . Metabolic acidosis 24/10/7351  . Hypernatremia 03/24/2013  . Nausea vomiting and diarrhea 03/24/2013   Past Medical History:  Past Medical History  Diagnosis Date  . Pancytopenia     caused by medications he is no longer taking  . Glaucoma   . COPD (chronic obstructive pulmonary disease)   . Mitral regurgitation   . Gout   . Colon polyps   . Cancer     skin  . Shingles   . Syncope   . Onychomycosis     fungal  . Shoulder fracture    Past Surgical History:  Past Surgical History  Procedure Laterality Date  . Tonsillectomy    . Adenoidectomy    . Eye surgery      laser-glaucoma  . Eye surgery      cataract   HPI:  80 y.o. male admitted to [facility] on 03/24/13 with near syncope, fall, NSTEMI (per cards demand ischemia), thrombocytopenia/anemia, abdominal mass.  Pt with PMHx of skin CA, COPD, glaucoma, and gout.    Assessment / Plan / Recommendation Clinical Impression  Pt presents with mild-moderate cognitive impairments characterized primarily by decreased sustained attention, working memory, and retrieval of new information, which all appear to impact basic problem solving skills and auditory comprehension of mildly complex information. Pt reports that this has been a gradual decline since his wife passed several years ago, and became emotional while talking with SLP; offered emotional support and education regarding support services. Pt does feel as though his cognition, particularly his memory and auditory comprehension  skills, have been acutely exacerbated this admission. Pt will benefit from SLP f/u to target the skills above in order to maximize functional independence.    SLP Assessment  Patient needs continued Speech Lanaguage Pathology Services    Follow Up Recommendations  Skilled Nursing facility;24 hour supervision/assistance    Frequency and Duration min 2x/week  1 week   Pertinent Vitals/Pain N/A   SLP Goals  SLP Goals Potential to Achieve Goals: Fair Potential Considerations: Ability to learn/carryover information;Previous level of function Progress/Goals/Alternative treatment plan discussed with pt/caregiver and they: Agree  SLP Evaluation Prior Functioning  Cognitive/Linguistic Baseline: Baseline deficits Baseline deficit details: Pt reports gradual decline since his wife passed several years ago, particularly with memory. He receives help from his daughter with his finances, and he lives in an independent living facility, where his meals are prepared for him. Type of Home: Independent living facility  Lives With: Alone Available Help at Discharge: Personal care attendant;Family;Available PRN/intermittently   Cognition  Overall Cognitive Status: No family/caregiver present to determine baseline cognitive functioning Arousal/Alertness: Awake/alert Orientation Level: Oriented X4 Attention: Sustained Sustained Attention: Impaired Sustained Attention Impairment: Verbal basic;Functional basic Memory: Impaired Memory Impairment: Retrieval deficit;Decreased recall of new information Awareness: Appears intact Problem Solving: Impaired Problem Solving Impairment: Verbal basic;Functional basic Safety/Judgment: Appears intact Comments: Pt also reports that he has not been sleeping lately.     Comprehension  Auditory Comprehension Overall Auditory Comprehension: Impaired Yes/No Questions: Impaired Complex Questions: 75-100% accurate Paragraph Comprehension (via yes/no questions):  76-100% accurate Commands: Within Functional Limits Conversation: Simple  Interfering Components: Attention;Processing speed;Working Field seismologist: Conservation officer, nature: Not tested Reading Comprehension Reading Status: Not tested    Expression Expression Primary Mode of Expression: Verbal Verbal Expression Overall Verbal Expression: Appears within functional limits for tasks assessed Written Expression Written Expression: Not tested   Oral / Motor Motor Speech Overall Motor Speech: Appears within functional limits for tasks assessed   GO     Germain Osgood, M.A. CCC-SLP [phone]  Germain Osgood 03/26/2013, 9:57 AM

## 2013-03-26 NOTE — Clinical Social Work Placement (Signed)
Clinical Social Work Department  CLINICAL SOCIAL WORK PLACEMENT NOTE  Patient: Dev Dhondt Account Number: [id number] Admit date: 03/24/13  Clinical Social Worker: Rhea Pink LCSWA Date/time: 2/17/201511:30 AM  Clinical Social Work is seeking post-discharge placement for this patient at the following level of care: SKILLED NURSING (*CSW will update this form in Epic as items are completed)  03/26/2013 Patient/family provided with [place] Department of Clinical Social Work's list of facilities offering this level of care within the geographic area requested by the patient (or if unable, by the patient's family).  03/26/2013 Patient/family informed of their freedom to choose among providers that offer the needed level of care, that participate in Medicare, Medicaid or managed care program needed by the patient, have an available bed and are willing to accept the patient.  03/26/2013 Patient/family informed of [facility]' ownership interest in [facility], as well as of the fact that they are under no obligation to receive care at this facility.  PASARR submitted to EDS on 03/26/2013  PASARR number received from EDS on 03/26/2013  FL2 transmitted to all facilities in geographic area requested by pt/family on 03/26/2013  FL2 transmitted to all facilities within larger geographic area on  Patient informed that his/her managed care company has contracts with or will negotiate with certain facilities, including the following:  Patient/family informed of bed offers received:  Patient chooses bed at [facility] Physician recommends and patient chooses bed at  Patient to be transferred to on  Patient to be transferred to facility by  The following physician request were entered in Epic:  Additional Comments:

## 2013-03-26 NOTE — Clinical Social Work Psychosocial (Signed)
Clinical Social Work Department  BRIEF PSYCHOSOCIAL ASSESSMENT  Patient: Golden Gilreath  Account Number: [id number]   Admit date: 03/24/13 Clinical Social Worker Rhea Pink, MSW Date/Time: 03/26/2013 9:30 AM Referred by: Physician Date Referred:  Referred for   SNF Placement   Other Referral:  Interview type: Patient's daughter Other interview type: PSYCHOSOCIAL DATA  Living Status: [place] Admitted from facility: [facility] Level of care: Independent Living Primary support name: Butch Penny Primary support relationship to patient: Daughter Degree of support available:  Strong and vested  CURRENT CONCERNS  Current Concerns   Post-Acute Placement   Other Concerns:  SOCIAL WORK ASSESSMENT / PLAN  CSW spoke with patient's daughter over the phonere: PT recommendation for SNF.   Pt lives in independent living at [facility]  CSW explained placement process and answered questions.   Pt reports Kinsley left a message with Pamala Hurry  CSW completed FL2 and initiated SNF search.     Assessment/plan status: Information/Referral to [employer]  Other assessment/ plan:  Information/referral to community resources:  SNF     PATIENT'S/FAMILY'S RESPONSE TO PLAN OF CARE:  [name]  reports that patient is agreeable to [facility] in order to increase strength and independence with mobility prior to independent living at [facility]. Patient's daughter requested to speak with MD. CSW contacted MD for patient's medical updatel  Pt verbalized understanding of placement process and appreciation for CSW assist.   Rhea Pink, MSW, [place]

## 2013-03-26 NOTE — Progress Notes (Signed)
Subjective: Could not sleep.  No other complaints.  Objective: Vital signs in last 24 hours: Temp:  [97.7 F (36.5 C)-98.9 F (37.2 C)] 97.9 F (36.6 C) (02/17 0538) Pulse Rate:  [69-87] 69 (02/17 0538) Resp:  [16-18] 18 (02/17 0538) BP: (140-176)/(52-70) 176/70 mmHg (02/17 0538) SpO2:  [94 %-100 %] 95 % (02/17 0538) Last BM Date: 03/24/13  Intake/Output from previous day: 02/16 0701 - 02/17 0700 In: -  Out: 850 [Urine:850] Intake/Output this shift:    Medications Current Facility-Administered Medications  Medication Dose Route Frequency Provider Last Rate Last Dose  . antiseptic oral rinse (BIOTENE) solution 15 mL  15 mL Mouth Rinse BID Rise Patience, MD   15 mL at 03/25/13 0800  . atorvastatin (LIPITOR) tablet 10 mg  10 mg Oral q1800 Rogelia Mire, NP      . latanoprost (XALATAN) 0.005 % ophthalmic solution 1 drop  1 drop Both Eyes QHS Rise Patience, MD   1 drop at 03/25/13 2213  . metoprolol tartrate (LOPRESSOR) tablet 12.5 mg  12.5 mg Oral BID Rogelia Mire, NP   12.5 mg at 03/25/13 1344  . pilocarpine (PILOCAR) 4 % ophthalmic solution 1 drop  1 drop Both Eyes QID Rise Patience, MD   1 drop at 03/25/13 2214  . polyvinyl alcohol (LIQUIFILM TEARS) 1.4 % ophthalmic solution 1 drop  1 drop Both Eyes Daily PRN Rise Patience, MD   1 drop at 03/25/13 1706  . senna-docusate (Senokot-S) tablet 1 tablet  1 tablet Oral QHS PRN Rise Patience, MD      . Tdap (BOOSTRIX) injection 0.5 mL  0.5 mL Intramuscular Once Rise Patience, MD        PE: General appearance: alert, cooperative and no distress Lungs: clear to auscultation bilaterally Heart: regular rate and rhythm, S1, S2 normal, 3/6 sys MM  Abdomen: +BS, Nontender Extremities: No LEE Pulses: 2+ and symmetric Skin: Warm and dry Neurologic: Grossly normal  Lab Results:   Recent Labs  03/24/13 1619 03/25/13 0545 03/26/13 0658  WBC 10.2 9.3 7.3  HGB 12.7* 11.0* 10.6*    HCT 37.9* 32.9* 31.2*  PLT 97* 85* 76*   BMET  Recent Labs  03/24/13 1619 03/25/13 0545 03/26/13 0450  NA 148* 141 142  K 3.8 3.9 3.5*  CL 115* 113* 109  CO2 16* 15* 21  GLUCOSE 105* 103* 98  BUN 43* 37* 28*  CREATININE 1.38* 1.27 1.27  CALCIUM 9.1 8.3* 8.5   PT/INR No results found for this basename: LABPROT, INR,  in the last 72 hours Cholesterol  Recent Labs  03/25/13 0545  CHOL 122   Lipid Panel     Component Value Date/Time   CHOL 122 03/25/2013 0545   TRIG 58 03/25/2013 0545   HDL 41 03/25/2013 0545   CHOLHDL 3.0 03/25/2013 0545   VLDL 12 03/25/2013 0545   LDLCALC 69 03/25/2013 0545     Assessment/Plan   Principal Problem:   Near syncope Active Problems:   NSTEMI (non-ST elevated myocardial infarction)   Thrombocytopenia   Anemia   Abdominal mass   Renal failure   Metabolic acidosis   Hypernatremia   Nausea vomiting and diarrhea  Plan:  Echo:  EF 45-50% with normal wall motion, grade 1 diastolic dysfunction, Mild to moderate MR.  SR with PVCs on tele.   No anginal symptoms.  BP elevated.  Will increase lopressor to 25. Consider adding ACE or ARB with the mildly  depressed EF.  Scr has improved.       LOS: 2 days    HAGER, BRYAN 03/26/2013 7:34 AM  I have seen and examined the patient along with Tarri Fuller, PA.  I have reviewed the chart, notes and new data.  I agree with PA's note.  Key new complaints: difficulty sleeping; no agnina, no dyspnea Key examination changes: no clinical signs of CHF, no arrhythmia Key new findings / data: renal function at baseline.  PLAN: Mildly depressed LVEF, but no regional abnormalities to support a diagnosis of recent coronary event. He is inclined to pursue palliative care option. Doubt we will recommend invasive cardiac evaluation. Although cardiomyopathy may be ischemic, will be best suited for medical Rx. Agree with increased beta blocker dose and addition of ACE inh, as long as he tolerates these meds  without adverse side effects.  Sanda Klein, MD, [place] [phone] 03/26/2013, 12:56 PM

## 2013-03-26 NOTE — Progress Notes (Signed)
Patient ID: Colin Hudson  male  XFG:[id number]    DOB: [birth year]    DOA: 03/24/2013  PCP: [name], MD  Assessment/Plan: Principal Problem:   Near syncope/ NSTEMI  - Cardiology following, troponins trending down now - 2-D echo was done which showed EF 45-50% with grade 1 diastolic dysfunction) systolic function mild to moderately reduced   Acute/subacute left parietal infarct - MRI of the brain was done which showed possible punctate acute or subacute left parietal infarct, small subdural hygromas, moderate chronic small vessel ischemic disease - patient does not want to pursue any further management or interventions. Patient is intolerant to aspirin, placed on Plavix 75 mg daily. - Lipid panel showed LDL 69, on statin     Thrombocytopenia/  Anemia - Continue to monitor counts, no active bleeding, platelets trending down    Abdominal mass/ duodenal tumor - Incidentally found on the CT abdomen, 3.7x 2.5 and 3.8 cm mass extending off the posterior aspect of the duodenum, suspicious for solid neoplasm recommending EGD and biopsy - Discussed in detail with the patient who does not want to pursue any further management, investigation or any surgery. Patient states that he is 80 year old and at this time does not wish to pursue any further management.  - Called the palliative medicine for goals of care, meeting scheduled at 4:30 PM today    Renal failure : Improving     Hypernatremia: Improving     Nausea vomiting and diarrhea: Improving , likely due to a duodenal tumor or carcinoid syndrome or viral gastroenteritis  DVT Prophylaxis: SCDs  Code Status: DO NOT RESUSCITATE   Family Communication: discussed in detail with the patient and his daughter on the phone   Disposition:Will likely need skilled nursing facility, patient is from [facility]    Subjective: Denies any specific complaints at this time no chest pain or shortness of breath, dizziness or  lightheadedness  Objective: Weight change:   Intake/Output Summary (Last 24 hours) at 03/26/13 1325 Last data filed at 03/26/13 0600  Gross per 24 hour  Intake      0 ml  Output    850 ml  Net   -850 ml   Blood pressure 150/66, pulse 71, temperature 98.7 F (37.1 C), temperature source Oral, resp. rate 18, height 5' 11.5" (1.816 m), weight 78.5 kg (173 lb 1 oz), SpO2 95.00%.  Physical Exam: General: Alert and awake, oriented x3, not in any acute distress. CVS: S1-S2 clear, SEM Chest: CTAB Abdomen: soft nontender, nondistended, normal bowel sounds  Extremities: no cyanosis, clubbing or edema noted bilaterally Neuro: Cranial nerves II-XII intact, no focal neurological deficits  Lab Results: Basic Metabolic Panel:  Recent Labs Lab 03/25/13 0545 03/26/13 0450  NA 141 142  K 3.9 3.5*  CL 113* 109  CO2 15* 21  GLUCOSE 103* 98  BUN 37* 28*  CREATININE 1.27 1.27  CALCIUM 8.3* 8.5   Liver Function Tests:  Recent Labs Lab 03/24/13 1619 03/25/13 0545  AST 106* 83*  ALT 36 32  ALKPHOS 105 85  BILITOT 0.3 0.3  PROT 7.8 6.7  ALBUMIN 3.7 3.1*    Recent Labs Lab 03/24/13 1619  LIPASE 14   No results found for this basename: AMMONIA,  in the last 168 hours CBC:  Recent Labs Lab 03/25/13 0545 03/26/13 0658  WBC 9.3 7.3  NEUTROABS 6.3  --   HGB 11.0* 10.6*  HCT 32.9* 31.2*  MCV 96.8 96.0  PLT 85* 76*   Cardiac Enzymes:  Recent Labs Lab 03/24/13 1725 03/24/13 2250 03/25/13 0542 03/25/13 0545 03/25/13 0946  CKTOTAL 1893*  --   --  1170*  --   TROPONINI 1.12* 0.96* 0.95*  --  0.76*   BNP: No components found with this basename: POCBNP,  CBG: No results found for this basename: GLUCAP,  in the last 168 hours   Micro Results: No results found for this or any previous visit (from the past 240 hour(s)).  Studies/Results: Dg Chest 2 View  03/24/2013   CLINICAL DATA:  Altered mental status.  Shortness of breath.  EXAM: CHEST  2 VIEW  COMPARISON:  CT  chest 09/16/2012 and PA and lateral chest 12/11/2012.  FINDINGS: Calcified pleural plaques are again seen. The lungs are clear. Heart size is normal. No pneumothorax or pleural effusion.  IMPRESSION: No acute finding.  Stable compared to prior exams.   Electronically Signed   By: Inge Rise M.D.   On: 03/24/2013 16:39   Ct Head Wo Contrast  03/24/2013   CLINICAL DATA:  Fall, head trauma  EXAM: CT HEAD WITHOUT CONTRAST  TECHNIQUE: Contiguous axial images were obtained from the base of the skull through the vertex without intravenous contrast.  COMPARISON:  None.  FINDINGS: No intracranial hemorrhage. No parenchymal contusion. No midline shift or mass effect. Basilar cisterns are patent. No skull base fracture. No fluid in the paranasal sinuses or mastoid air cells. There is extra-axial fluid collection anterior to the right frontal lobe is low-density most suggestive of a chronic hygroma. No midline shift or mass effect from this fluid collection.  Visualized cortical atrophy. There is periventricular subcortical white matter hypodensities.  No evidence of skull fracture. No fluid the paranasal sinuses or mastoid air cells.  IMPRESSION: 1. No intracranial trauma. 2. A chronic low-density extra-axial fluid collection anterior to the right frontal lobe likely represents hygroma. 3. Atrophy and microvascular disease. 4. No skull fracture.   Electronically Signed   By: Suzy Bouchard M.D.   On: 03/24/2013 17:59   Ct Abdomen Pelvis W Contrast  03/24/2013   CLINICAL DATA:  Diarrhea and abdominal pain.  EXAM: CT ABDOMEN AND PELVIS WITH CONTRAST  TECHNIQUE: Multidetector CT imaging of the abdomen and pelvis was performed using the standard protocol following bolus administration of intravenous contrast.  CONTRAST:  6mL OMNIPAQUE IOHEXOL 300 MG/ML  SOLN  COMPARISON:  CT of the abdomen and pelvis 06/06/2008.  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 Severe calcifications of the aortic valve. Atherosclerotic calcifications throughout the right coronary artery.  Abdomen/Pelvis: The appearance of the liver, gallbladder, pancreas, spleen and bilateral adrenal glands is unremarkable. Sub cm low-attenuation lesions in the left kidney are too small to definitively characterize. Mild atrophy of the right kidney. There are 2 simple cysts in the right kidney, largest of which measures 2.6 cm in diameter extending exophytically off the posterior aspect of the lower pole.  Image 38 of series 3 demonstrates a well-circumscribed ovoid shaped mass measuring 3.7 x 2.5 x 3.8 cm extending off the posterior aspect of the proximal small bowel at the junction of the second and third portions of the duodenum, which measures approximately 49 HU on early phase postcontrast images, increasing to 65 HU on more delayed phase postcontrast imaging, suspicious for a solid neoplasm such as a GI stromal tumor. This is new compared to prior study 06/06/2008.  Extensive atherosclerosis throughout the abdominal and pelvic vasculature, without evidence of  aneurysm or dissection. There are numerous colonic diverticulae, particularly in the region of the sigmoid colon, without surrounding inflammatory changes to suggest an acute diverticulitis at this time. Normal appendix. No significant volume of ascites. No pneumoperitoneum. No pathologic distention of small bowel. No definite lymphadenopathy identified within the abdomen or pelvis. In the posterior aspect of the urinary bladder there is a 6 mm calcification, most compatible with a bladder calculus. Prostate gland is unremarkable in appearance.  Musculoskeletal: There are no aggressive appearing lytic or blastic lesions noted in the visualized portions of the skeleton.  IMPRESSION: 1. No definite acute findings to account for the patient's symptoms. 2. However, there is a new 3.7 x 2.5 x 3.8 cm mass extending  off the posterior aspect of the duodenum, which has imaging characteristics suspicious for a solid neoplasm such as a small GI stromal tumor (GIST). Correlation with EGD and biopsy is recommended if clinically appropriate. 3. Extensive colonic diverticulosis without findings to suggest acute diverticulitis at this time. 4. Severe atherosclerosis. 5. 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 6. Severe calcifications of the aortic valve. Echocardiographic correlation for evidence of underlying valvular dysfunction may be warranted if clinically appropriate. 7. 6 mm calculus lying dependently in the urinary bladder. 8. Additional incidental findings, as above.   Electronically Signed   By: Vinnie Langton M.D.   On: 03/24/2013 18:08   Dg Pelvis Portable  03/25/2013   CLINICAL DATA:  Left hip pain  EXAM: PORTABLE PELVIS 1-2 VIEWS  COMPARISON:  CT ABD/PELVIS W CM dated 03/24/2013  FINDINGS: Hips are located. No evidence of pelvic fracture or sacral fracture.  IMPRESSION: No evidence of hip fracture.   Electronically Signed   By: Suzy Bouchard M.D.   On: 03/25/2013 00:10    Medications: Scheduled Meds: . antiseptic oral rinse  15 mL Mouth Rinse BID  . atorvastatin  10 mg Oral q1800  . latanoprost  1 drop Both Eyes QHS  . losartan  25 mg Oral Daily  . metoprolol tartrate  25 mg Oral BID  . pilocarpine  1 drop Both Eyes QID  . tamsulosin  0.4 mg Oral Daily  . Tdap  0.5 mL Intramuscular Once      LOS: 2 days   Luree Palla M.D. [facility] 03/26/2013, 1:25 PM Pager: [phone]  If 7PM-7AM, please contact night-coverage [email or web address] Password [email or web address]

## 2013-03-27 DIAGNOSIS — E872 Acidosis, unspecified: Secondary | ICD-10-CM

## 2013-03-27 DIAGNOSIS — I519 Heart disease, unspecified: Secondary | ICD-10-CM

## 2013-03-27 DIAGNOSIS — N19 Unspecified kidney failure: Secondary | ICD-10-CM

## 2013-03-27 LAB — BASIC METABOLIC PANEL
BUN: 28 mg/dL — ABNORMAL HIGH (ref 6–23)
CO2: 21 meq/L (ref 19–32)
CREATININE: 1.22 mg/dL (ref 0.50–1.35)
Calcium: 8.6 mg/dL (ref 8.4–10.5)
Chloride: 106 mEq/L (ref 96–112)
GFR calc Af Amer: 57 mL/min — ABNORMAL LOW (ref >90)
GFR calc non Af Amer: 50 mL/min — ABNORMAL LOW (ref >90)
Glucose, Bld: 95 mg/dL (ref 70–99)
Potassium: 3.5 mEq/L — ABNORMAL LOW (ref 3.7–5.3)
Sodium: 137 mEq/L (ref 137–147)

## 2013-03-27 LAB — CBC
HCT: 30.4 % — ABNORMAL LOW (ref 39.0–52.0)
HEMOGLOBIN: 10.2 g/dL — AB (ref 13.0–17.0)
MCH: 32.3 pg (ref 26.0–34.0)
MCHC: 33.6 g/dL (ref 30.0–36.0)
MCV: 96.2 fL (ref 78.0–100.0)
PLATELETS: 77 10*3/uL — AB (ref 150–400)
RBC: 3.16 MIL/uL — AB (ref 4.22–5.81)
RDW: 14.8 % (ref 11.5–15.5)
WBC: 5.2 10*3/uL (ref 4.0–10.5)

## 2013-03-27 MED ORDER — METOPROLOL TARTRATE 25 MG PO TABS: 25.0000 mg | ORAL_TABLET | Freq: Two times a day (BID) | ORAL | Status: DC

## 2013-03-27 MED ORDER — CLOPIDOGREL BISULFATE 75 MG PO TABS: 75.0000 mg | ORAL_TABLET | Freq: Every day | ORAL | Status: DC

## 2013-03-27 MED ORDER — LOSARTAN POTASSIUM 25 MG PO TABS: 25.0000 mg | ORAL_TABLET | Freq: Every day | ORAL | Status: DC

## 2013-03-27 MED ORDER — TAMSULOSIN HCL 0.4 MG PO CAPS: 0.4000 mg | ORAL_CAPSULE | Freq: Every day | ORAL | Status: DC

## 2013-03-27 NOTE — Progress Notes (Signed)
Patient SK:AJGOTL Star      DOB: 03-12-1918      [id number]  Talked with daughter by phone.  She expressed concern about discharge today.  Related concerns to Dr. Hartford Poli who was kind enough to speak with her via phone.  Patient comfortable and resolute about up in coming changes.  Jie Stickels L. Lovena Le, MD MBA The Palliative Medicine Team at [facility] Phone: [phone] Pager: [phone]

## 2013-03-27 NOTE — Discharge Summary (Signed)
Physician Discharge Summary  Colin Hudson PYP:[id number] DOB: 1919/02/04 DOA: 03/24/2013  PCP: [name], MD  Admit date: 03/24/2013 Discharge date: 03/27/2013  Time spent: 40 minutes  Recommendations for Outpatient Follow-up:  1. Followup with primary care physician within one week  Discharge Diagnoses:  Principal Problem:   Near syncope Active Problems:   NSTEMI (non-ST elevated myocardial infarction)   Thrombocytopenia   Anemia   Abdominal mass   Renal failure   Metabolic acidosis   Hypernatremia   Nausea vomiting and diarrhea   Discharge Condition: Stable  Diet recommendation: Heart healthy diet  Filed Weights   03/24/13 2149 03/27/13 0400  Weight: 78.5 kg (173 lb 1 oz) 79.243 kg (174 lb 11.2 oz)    History of present illness:  Colin Hudson is a 80 y.o. male with history of glaucoma, COPD, mitral regurgitation was brought to the ER after patient was found to be on the floor as found in his independent living facility. Patient states he has been having diarrhea with nausea and vomiting for the last few days. He was unable to keep anything. He was going to the bathroom when he fell. Patient was found conscious. He was found in the bathroom floor with stools around. Family feels that patient also was very confused initially. In the ER patient had lab works done which shows elevated troponin for which cardiologist was consulted. In addition labs also show hypernatremia, anemia and thrombocytopenia with renal failure. CT head did not show any acute. 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 Patient on exam is nonfocal. Has bruising in multiple areas. Complaints of pain on moving his left hip area. Denies any chest pain or shortness of breath. She states he feels very weak and wants to eat something.   Hospital Course:   1. Acute/subacute left parietal infarct: MRI of the brain was  done and showed possible punctate acute or subacute left parietal infarct, small subdural hygromas or chronic small vessel ischemic disease. Patient does not want to pursue any further management or intervention. Patient is intolerant to aspirin, placed on Plavix 75 mg daily. Lipid panel showed LDL of 69, he is already on statin and that is continued.  2. Non-STEMI/near-syncope: Presents with troponin of 1.1, cardiology consulted, troponin is trending down nicely, patient also does not want any aggressive intervention to be done, placed on metoprolol, losartan and Plavix. 2-D echocardiogram showed ejection fraction of 45-50% with grade 1 diastolic dysfunction. No diuretic at the time of discharge, patient might need further adjustment of medications as outpatient.  3. Abdominal masses/duodenal tumor: Incidentally found CT of abdomen, 3.7x2.5x3.8 cm mass extending from the posterior aspect of the duodenum suspicious for solid neoplasm. This is discussed by Dr. Tana Coast with the patient and his family and they do not want to pursue any further workup. Palliative medicine was called and they met with the family yesterday and confirmed the goals of care with his daughter. Patient is DO NOT RESUSCITATE, does not want workup for his delusional mass.  4. Acute renal failure: Presented with creatinine of 1.38 and BUN of 43 indicates dehydration, patient hydrated with IV fluids at the day of discharge creatinine is 1.22 and BUN is 28.  5. Metabolic acidosis: At the time of admission patient had sodium of 184, bicarbonate of 16, BUN/creatinine of 43/1.38 ALT is findings suggest dehydration with non-anion gap metabolic acidosis, this could be secondary to Diamox diuretic. Acetazolamide discontinue  the time of discharge.  Procedures:  None  Consultations:  [facility].  Palliative medicine team.  Discharge Exam: Filed Vitals:   03/27/13 0804  BP: 108/48  Pulse: 64  Temp: 98.8 F (37.1 C)  Resp:    General: Alert and awake, oriented x3, not in any acute distress. HEENT: anicteric sclera, pupils reactive to light and accommodation, EOMI CVS: S1-S2 clear, no murmur rubs or gallops Chest: clear to auscultation bilaterally, no wheezing, rales or rhonchi Abdomen: soft nontender, nondistended, normal bowel sounds, no organomegaly Extremities: no cyanosis, clubbing or edema noted bilaterally Neuro: Cranial nerves II-XII intact, no focal neurological deficits  Discharge Instructions  Discharge Orders   Future Orders Complete By Expires   Diet - low sodium heart healthy  As directed    Increase activity slowly  As directed        Medication List    STOP taking these medications       acetaZOLAMIDE 250 MG tablet  Commonly known as:  DIAMOX      TAKE these medications       clopidogrel 75 MG tablet  Commonly known as:  PLAVIX  Take 1 tablet (75 mg total) by mouth daily with breakfast.     losartan 25 MG tablet  Commonly known as:  COZAAR  Take 1 tablet (25 mg total) by mouth daily.     metoprolol tartrate 25 MG tablet  Commonly known as:  LOPRESSOR  Take 1 tablet (25 mg total) by mouth 2 (two) times daily.     pilocarpine 4 % ophthalmic solution  Commonly known as:  PILOCAR  Place 1 drop into both eyes 4 (four) times daily.     SYSTANE 0.4-0.3 % Soln  Generic drug:  Polyethyl Glycol-Propyl Glycol  Apply 1 drop to eye daily as needed (for dry eyes).     tamsulosin 0.4 MG Caps capsule  Commonly known as:  FLOMAX  Take 1 capsule (0.4 mg total) by mouth daily.     travoprost (benzalkonium) 0.004 % ophthalmic solution  Commonly known as:  TRAVATAN  Place 1 drop into both eyes at bedtime.       Allergies  Allergen Reactions  . Aspirin Swelling    Face and eyes       Follow-up Information   Follow up with [name], MD In 1 week.   Specialty:  Internal Medicine   Contact information:   [address] [place] [address] [phone]         The results of significant diagnostics from this hospitalization (including imaging, microbiology, ancillary and laboratory) are listed below for reference.    Significant Diagnostic Studies: Dg Chest 2 View  03/24/2013   CLINICAL DATA:  Altered mental status.  Shortness of breath.  EXAM: CHEST  2 VIEW  COMPARISON:  CT chest 09/16/2012 and PA and lateral chest 12/11/2012.  FINDINGS: Calcified pleural plaques are again seen. The lungs are clear. Heart size is normal. No pneumothorax or pleural effusion.  IMPRESSION: No acute finding.  Stable compared to prior exams.   Electronically Signed   By: Inge Rise M.D.   On: 03/24/2013 16:39   Ct Head Wo Contrast  03/24/2013   CLINICAL DATA:  Fall, head trauma  EXAM: CT HEAD WITHOUT CONTRAST  TECHNIQUE: Contiguous axial images were obtained from the base of the skull through the vertex without intravenous contrast.  COMPARISON:  None.  FINDINGS: No intracranial hemorrhage. No parenchymal contusion. No midline shift or mass effect.  Basilar cisterns are patent. No skull base fracture. No fluid in the paranasal sinuses or mastoid air cells. There is extra-axial fluid collection anterior to the right frontal lobe is low-density most suggestive of a chronic hygroma. No midline shift or mass effect from this fluid collection.  Visualized cortical atrophy. There is periventricular subcortical white matter hypodensities.  No evidence of skull fracture. No fluid the paranasal sinuses or mastoid air cells.  IMPRESSION: 1. No intracranial trauma. 2. A chronic low-density extra-axial fluid collection anterior to the right frontal lobe likely represents hygroma. 3. Atrophy and microvascular disease. 4. No skull fracture.   Electronically Signed   By: Suzy Bouchard M.D.   On: 03/24/2013 17:59   Mr Brain Wo Contrast  03/26/2013   CLINICAL DATA:  Fall.  Found down.  Evaluate for stroke.  EXAM: MRI HEAD WITHOUT CONTRAST  TECHNIQUE: Multiplanar, multiecho pulse  sequences of the brain and surrounding structures were obtained without intravenous contrast.  COMPARISON:  Head CT 03/24/2013  FINDINGS: There is a punctate focus of increased diffusion weighted signal within the left parietal cortex (series 4, image 23). Restricted diffusion is not definitely identified on the ADC map, although evaluation is limited by its small size. There is no other evidence of acute infarct. Mildly prominent CSF collections are present over the right greater than left frontal lobes. There is no evidence of intracranial hemorrhage. Patchy T2 hyperintensities are present within the subcortical and deep cerebral white matter bilaterally, nonspecific but compatible with moderate chronic small vessel ischemic disease. There is no evidence of mass or midline shift. There is moderate cerebral atrophy.  Prior left cataract surgery is noted. Major intracranial vascular flow voids are unremarkable. Minimal left maxillary sinus mucosal thickening is noted. Mastoid air cells are clear.  IMPRESSION: 1. Possible punctate acute or subacute left parietal infarct. 2. Mildly prominent CSF spaces over the right greater than left frontal lobes could reflect small subdural hygromas. 3. Moderate chronic small vessel ischemic disease.   Electronically Signed   By: Logan Bores   On: 03/26/2013 10:21   Ct Abdomen Pelvis W Contrast  03/24/2013   CLINICAL DATA:  Diarrhea and abdominal pain.  EXAM: CT ABDOMEN AND PELVIS WITH CONTRAST  TECHNIQUE: Multidetector CT imaging of the abdomen and pelvis was performed using the standard protocol following bolus administration of intravenous contrast.  CONTRAST:  38m OMNIPAQUE IOHEXOL 300 MG/ML  SOLN  COMPARISON:  CT of the abdomen and pelvis 06/06/2008.  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 Severe calcifications of the aortic valve. Atherosclerotic calcifications throughout  the right coronary artery.  Abdomen/Pelvis: The appearance of the liver, gallbladder, pancreas, spleen and bilateral adrenal glands is unremarkable. Sub cm low-attenuation lesions in the left kidney are too small to definitively characterize. Mild atrophy of the right kidney. There are 2 simple cysts in the right kidney, largest of which measures 2.6 cm in diameter extending exophytically off the posterior aspect of the lower pole.  Image 38 of series 3 demonstrates a well-circumscribed ovoid shaped mass measuring 3.7 x 2.5 x 3.8 cm extending off the posterior aspect of the proximal small bowel at the junction of the second and third portions of the duodenum, which measures approximately 49 HU on early phase postcontrast images, increasing to 65 HU on more delayed phase postcontrast imaging, suspicious for a solid neoplasm such as a GI stromal tumor. This is new compared to prior study 06/06/2008.  Extensive atherosclerosis throughout the abdominal and pelvic vasculature, without evidence of aneurysm or dissection. There are numerous colonic diverticulae, particularly in the region of the sigmoid colon, without surrounding inflammatory changes to suggest an acute diverticulitis at this time. Normal appendix. No significant volume of ascites. No pneumoperitoneum. No pathologic distention of small bowel. No definite lymphadenopathy identified within the abdomen or pelvis. In the posterior aspect of the urinary bladder there is a 6 mm calcification, most compatible with a bladder calculus. Prostate gland is unremarkable in appearance.  Musculoskeletal: There are no aggressive appearing lytic or blastic lesions noted in the visualized portions of the skeleton.  IMPRESSION: 1. No definite acute findings to account for the patient's symptoms. 2. However, there is a new 3.7 x 2.5 x 3.8 cm mass extending off the posterior aspect of the duodenum, which has imaging characteristics suspicious for a solid neoplasm such as a  small GI stromal tumor (GIST). Correlation with EGD and biopsy is recommended if clinically appropriate. 3. Extensive colonic diverticulosis without findings to suggest acute diverticulitis at this time. 4. Severe atherosclerosis. 5. 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 6. Severe calcifications of the aortic valve. Echocardiographic correlation for evidence of underlying valvular dysfunction may be warranted if clinically appropriate. 7. 6 mm calculus lying dependently in the urinary bladder. 8. Additional incidental findings, as above.   Electronically Signed   By: Vinnie Langton M.D.   On: 03/24/2013 18:08   Dg Pelvis Portable  03/25/2013   CLINICAL DATA:  Left hip pain  EXAM: PORTABLE PELVIS 1-2 VIEWS  COMPARISON:  CT ABD/PELVIS W CM dated 03/24/2013  FINDINGS: Hips are located. No evidence of pelvic fracture or sacral fracture.  IMPRESSION: No evidence of hip fracture.   Electronically Signed   By: Suzy Bouchard M.D.   On: 03/25/2013 00:10    Microbiology: No results found for this or any previous visit (from the past 240 hour(s)).   Labs: Basic Metabolic Panel:  Recent Labs Lab 03/24/13 1619 03/25/13 0545 03/26/13 0450 03/27/13 0555  NA 148* 141 142 137  K 3.8 3.9 3.5* 3.5*  CL 115* 113* 109 106  CO2 16* 15* 21 21  GLUCOSE 105* 103* 98 95  BUN 43* 37* 28* 28*  CREATININE 1.38* 1.27 1.27 1.22  CALCIUM 9.1 8.3* 8.5 8.6   Liver Function Tests:  Recent Labs Lab 03/24/13 1619 03/25/13 0545  AST 106* 83*  ALT 36 32  ALKPHOS 105 85  BILITOT 0.3 0.3  PROT 7.8 6.7  ALBUMIN 3.7 3.1*    Recent Labs Lab 03/24/13 1619  LIPASE 14   No results found for this basename: AMMONIA,  in the last 168 hours CBC:  Recent Labs Lab 03/24/13 1619 03/25/13 0545 03/26/13 0658 03/27/13 0555  WBC 10.2 9.3 7.3 5.2  NEUTROABS 6.9 6.3  --   --   HGB 12.7* 11.0* 10.6* 10.2*  HCT 37.9* 32.9* 31.2* 30.4*   MCV 96.4 96.8 96.0 96.2  PLT 97* 85* 76* 77*   Cardiac Enzymes:  Recent Labs Lab 03/24/13 1725 03/24/13 2250 03/25/13 0542 03/25/13 0545 03/25/13 0946  CKTOTAL 1893*  --   --  1170*  --   TROPONINI 1.12* 0.96* 0.95*  --  0.76*   BNP: BNP (last 3 results) No results found for this basename: PROBNP,  in the last 8760 hours CBG: No results found for this basename: GLUCAP,  in the last 168 hours  Signed:  Daviyon Widmayer A  [facility] 03/27/2013, 10:32 AM

## 2013-03-27 NOTE — Progress Notes (Signed)
CSW (Clinical Education officer, museum) informed by MD that plan will be for [employer]. CSW updated facility and they are okay with dc tomorrow. CSW will just need updated dc summary. MD is aware of this.  [place], [place]

## 2013-03-27 NOTE — Progress Notes (Signed)
CSW (Clinical Education officer, museum) called [facility] to confirm pt was ready for dc today. CSW aware pt wanting ALF but facility informed CSW that with pt current assistance need he will need to dc to their care center (SNF). CSW spoke with pt and updated on this. Pt was agreeable to dc to [facility] SNF.  [place], [place]

## 2013-03-27 NOTE — Consult Note (Signed)
Palliative Medicine Team  I met with patient and his daughter. He does not want any intervention for his mass. Back to [place] with Hospice-he would like to go in to ALF instead of SNF. He is ambulatory.  Lane Hacker, DO Palliative Medicine

## 2013-03-27 NOTE — Progress Notes (Signed)
Subjective: Slept better last night.  No CP or SOB.   Objective: Vital signs in last 24 hours: Temp:  [98.1 F (36.7 C)-98.7 F (37.1 C)] 98.1 F (36.7 C) (02/18 0400) Pulse Rate:  [60-72] 60 (02/18 0400) Resp:  [18] 18 (02/17 1600) BP: (125-159)/(53-71) 136/55 mmHg (02/18 0400) SpO2:  [95 %-97 %] 96 % (02/18 0400) Weight:  [174 lb 11.2 oz (79.243 kg)] 174 lb 11.2 oz (79.243 kg) (02/18 0400) Last BM Date: 03/24/13  Intake/Output from previous day: 02/17 0701 - 02/18 0700 In: -  Out: 425 [Urine:425] Intake/Output this shift:    Medications Current Facility-Administered Medications  Medication Dose Route Frequency Provider Last Rate Last Dose  . antiseptic oral rinse (BIOTENE) solution 15 mL  15 mL Mouth Rinse BID Rise Patience, MD   15 mL at 03/26/13 2000  . atorvastatin (LIPITOR) tablet 10 mg  10 mg Oral q1800 Rogelia Mire, NP   10 mg at 03/26/13 1701  . clopidogrel (PLAVIX) tablet 75 mg  75 mg Oral Q breakfast Ripudeep Krystal Eaton, MD   75 mg at 03/26/13 1701  . latanoprost (XALATAN) 0.005 % ophthalmic solution 1 drop  1 drop Both Eyes QHS Rise Patience, MD   1 drop at 03/26/13 2108  . losartan (COZAAR) tablet 25 mg  25 mg Oral Daily Ripudeep K Rai, MD   25 mg at 03/26/13 1011  . metoprolol tartrate (LOPRESSOR) tablet 25 mg  25 mg Oral BID Tarri Fuller, PA-C   25 mg at 03/26/13 2109  . pilocarpine (PILOCAR) 4 % ophthalmic solution 1 drop  1 drop Both Eyes QID Rise Patience, MD   1 drop at 03/26/13 2114  . polyvinyl alcohol (LIQUIFILM TEARS) 1.4 % ophthalmic solution 1 drop  1 drop Both Eyes Daily PRN Rise Patience, MD   1 drop at 03/25/13 1706  . senna-docusate (Senokot-S) tablet 1 tablet  1 tablet Oral QHS PRN Rise Patience, MD      . tamsulosin [facility]) capsule 0.4 mg  0.4 mg Oral Daily Ripudeep K Rai, MD   0.4 mg at 03/26/13 1011  . Tdap (BOOSTRIX) injection 0.5 mL  0.5 mL Intramuscular Once Rise Patience, MD      . zolpidem (AMBIEN)  tablet 5 mg  5 mg Oral QHS PRN Ripudeep Krystal Eaton, MD        PE: General appearance: alert, cooperative and no distress  Lungs: clear to auscultation bilaterally  Heart: regular rate and rhythm, S1, S2 normal, 3/6 sys MM  Extremities: No LEE  Pulses: 2+ and symmetric  Skin: Warm and dry  Neurologic: Grossly normal   Lab Results:   Recent Labs  03/25/13 0545 03/26/13 0658 03/27/13 0555  WBC 9.3 7.3 5.2  HGB 11.0* 10.6* 10.2*  HCT 32.9* 31.2* 30.4*  PLT 85* 76* 77*   BMET  Recent Labs  03/25/13 0545 03/26/13 0450 03/27/13 0555  NA 141 142 137  K 3.9 3.5* 3.5*  CL 113* 109 106  CO2 15* 21 21  GLUCOSE 103* 98 95  BUN 37* 28* 28*  CREATININE 1.27 1.27 1.22  CALCIUM 8.3* 8.5 8.6   PT/INR No results found for this basename: LABPROT, INR,  in the last 72 hours Cholesterol  Recent Labs  03/25/13 0545  CHOL 122   Lipid Panel     Component Value Date/Time   CHOL 122 03/25/2013 0545   TRIG 58 03/25/2013 0545   HDL 41 03/25/2013 0545  CHOLHDL 3.0 03/25/2013 0545   VLDL 12 03/25/2013 0545   LDLCALC 69 03/25/2013 0545     Assessment/Plan   Principal Problem:   Near syncope Active Problems:   NSTEMI (non-ST elevated myocardial infarction)   Thrombocytopenia   Anemia   Abdominal mass   Renal failure   Metabolic acidosis   Hypernatremia   Nausea vomiting and diarrhea  Plan:  Echo: EF 45-50% with normal wall motion, grade 1 diastolic dysfunction, Mild to moderate MR. SR with PVCs on tele. No anginal symptoms.  Plavix. BP better.  Lopressor, cozaar   Scr has improved.   Lipid look good-on lipitor.   No further cardiac workup.  Medical mgt.  Palliative medicine to follow.       LOS: 3 days    HAGER, BRYAN 03/27/2013 7:51 AM  I have seen and examined the patient along with Alcide Goodness, PA.  I have reviewed the chart, notes and new data.  I agree with PA's note.  PLAN: Tolerating adjustments in CHF meds so far. No further titration planned at this time. May  increase further at outpatient follow up. Please call us back with any other Cardiology questions.  Sanda Klein, MD, [place] [phone] 03/27/2013, 9:52 AM

## 2013-03-28 LAB — CBC
HEMATOCRIT: 30.5 % — AB (ref 39.0–52.0)
Hemoglobin: 10.2 g/dL — ABNORMAL LOW (ref 13.0–17.0)
MCH: 32.1 pg (ref 26.0–34.0)
MCHC: 33.4 g/dL (ref 30.0–36.0)
MCV: 95.9 fL (ref 78.0–100.0)
Platelets: 87 10*3/uL — ABNORMAL LOW (ref 150–400)
RBC: 3.18 MIL/uL — AB (ref 4.22–5.81)
RDW: 14.6 % (ref 11.5–15.5)
WBC: 4.3 10*3/uL (ref 4.0–10.5)

## 2013-03-28 LAB — BASIC METABOLIC PANEL
BUN: 28 mg/dL — AB (ref 6–23)
CALCIUM: 8.4 mg/dL (ref 8.4–10.5)
CHLORIDE: 107 meq/L (ref 96–112)
CO2: 24 meq/L (ref 19–32)
Creatinine, Ser: 1.23 mg/dL (ref 0.50–1.35)
GFR calc Af Amer: 57 mL/min — ABNORMAL LOW (ref >90)
GFR calc non Af Amer: 49 mL/min — ABNORMAL LOW (ref >90)
Glucose, Bld: 92 mg/dL (ref 70–99)
Potassium: 3.6 mEq/L — ABNORMAL LOW (ref 3.7–5.3)
Sodium: 140 mEq/L (ref 137–147)

## 2013-03-28 MED ORDER — POLYETHYLENE GLYCOL 3350 17 G PO PACK
17.0000 g | PACK | Freq: Every day | ORAL | Status: DC
  Filled 2013-03-28: qty 1

## 2013-03-28 NOTE — Discharge Instructions (Signed)
Syncope °Syncope means a person passes out (faints). The person usually wakes up in less than 5 minutes. It is important to seek medical care for syncope. °HOME CARE °· Have someone stay with you until you feel normal. °· Do not drive, use machines, or play sports until your doctor says it is okay. °· Keep all doctor visits as told. °· Lie down when you feel like you might pass out. Take deep breaths. Wait until you feel normal before standing up. °· Drink enough fluids to keep your pee (urine) clear or pale yellow. °· If you take blood pressure or heart medicine, get up slowly. Take several minutes to sit and then stand. °GET HELP RIGHT AWAY IF:  °· You have a severe headache. °· You have pain in the chest, belly (abdomen), or back. °· You are bleeding from the mouth or butt (rectum). °· You have black or tarry poop (stool). °· You have an irregular or very fast heartbeat. °· You have pain with breathing. °· You keep passing out, or you have shaking (seizures) when you pass out. °· You pass out when sitting or lying down. °· You feel confused. °· You have trouble walking. °· You have severe weakness. °· You have vision problems. °If you fainted, call your local emergency services (911 in [place] Do not drive yourself to the hospital. °MAKE SURE YOU:  °· Understand these instructions. °· Will watch your condition. °· Will get help right away if you are not doing well or get worse. °Document Released: 07/13/2007 Document Revised: 07/26/2011 Document Reviewed: 03/25/2011 °ExitCare® Patient Information ©2014 [employer]. ° °

## 2013-03-28 NOTE — Progress Notes (Signed)
SLP Cancellation Note  Patient Details Name: Colin Hudson MRN: [id number] DOB: March 20, 1918   Cancelled treatment:        Attempted cognitive tx, deferred as pt being prepared by transport for d/c and is on stretcher ready to go.   Colin Hudson, Annye Rusk 03/28/2013, 1:36 PM

## 2013-03-28 NOTE — Progress Notes (Signed)
Subjective: Denies any complaints, did not have bowel movements for 3 days.  Objective Filed Vitals:   03/28/13 0522  BP: 154/52  Pulse: 59  Temp: 98.6 F (37 C)  Resp: 18  General: Alert and awake, not in any acute distress. HEENT: anicteric sclera, pupils reactive to light and accommodation, EOMI CVS: S1-S2 clear, no murmur rubs or gallops Chest: clear to auscultation bilaterally, no wheezing, rales or rhonchi Abdomen: soft nontender, nondistended, normal bowel sounds, no organomegaly Extremities: no cyanosis, clubbing or edema noted bilaterally Neuro: Cranial nerves II-XII intact, no focal neurological deficits  Assessment and plan: See discharge note from today Acute/subacute left parietal infarct. Non-STEMI/near-syncope. Abdominal masses/temporal tumor. Acute renal failure. Metabolic acidosis.   Birdie Hopes Pager: [phone] 03/28/2013, 10:40 AM

## 2013-03-28 NOTE — Progress Notes (Signed)
Physical Therapy Treatment Patient Details Name: Colin Hudson MRN: [id number] DOB: [birth year] Today's Date: 03/28/2013 Time: 3825-0539 PT Time Calculation (min): 21 min  PT Assessment / Plan / Recommendation  History of Present Illness 80 y.o. male admitted to [facility] on 03/24/13 with near syncope, fall, NSTEMI (per cards demand ischemia), thrombocytopenia/anemia, abdominal mass.  Pt with PMHx of skin CA, COPD (with baseline DOE per pt report), glaucoma, and gout.     PT Comments   Pt is becoming more deconditioned the longer he is here in the hospital.  He continues to be appropriate for SNF placement for rehab at d/c with hopes of returning to his prior level of independence.    Follow Up Recommendations  SNF     Does the patient have the potential to tolerate intense rehabilitation    NA  Barriers to Discharge   None      Equipment Recommendations  Rolling walker with 5" wheels    Recommendations for Other Services   None  Frequency Min 2X/week   Progress towards PT Goals Progress towards PT goals: Progressing toward goals  Plan Current plan remains appropriate    Precautions / Restrictions Precautions Precautions: Fall Precaution Comments: generally weak and deconditioned.    Pertinent Vitals/Pain See vitals flow sheet.    Mobility  Transfers Overall transfer level: Needs assistance Equipment used: Rolling walker (2 wheeled) Transfers: Sit to/from Stand Sit to Stand: Mod assist General transfer comment: mod assist to support trunk during transitions from both bed and toilet.  Pt with a tendancy to lean posteriorly during transitions.  Verbal cues for safe hand placement.  Ambulation/Gait Ambulation/Gait assistance: Min assist Ambulation Distance (Feet): 20 Feet Assistive device: Rolling walker (2 wheeled) Gait Pattern/deviations: Step-through pattern;Shuffle;Trunk flexed Gait velocity: decreased Gait velocity interpretation: Below normal speed for  age/gender General Gait Details: Pt with flexed knees in stance, needs verbal cues to stand closer to RW and assist at trunk for balance and support over weak legs.     Exercises General Exercises - Upper Extremity Shoulder Flexion: AROM;Both;10 reps;Seated Elbow Flexion: AROM;Both;10 reps;Seated General Exercises - Lower Extremity Ankle Circles/Pumps: AROM;Both;10 reps;Seated Long Arc Quad: AROM;Right;10 reps;Seated;Other (comment) (could not do on left without left hip pain, so stopped) Hip Flexion/Marching: AROM;Right;10 reps;Seated;Other (comment) (could not do on left without left hip pain, so stopped) Toe Raises: AROM;Both;10 reps;Seated Heel Raises: AROM;Both;10 reps;Seated     PT Goals (current goals can now be found in the care plan section) Acute Rehab PT Goals Patient Stated Goal: to get stronger and go back to his apartment.   Visit Information  Last PT Received On: 03/28/13 Assistance Needed: +1 History of Present Illness: 80 y.o. male admitted to [facility] on 03/24/13 with near syncope, fall, NSTEMI (per cards demand ischemia), thrombocytopenia/anemia, abdominal mass.  Pt with PMHx of skin CA, COPD (with baseline DOE per pt report), glaucoma, and gout.      Subjective Data  Subjective: Pt reports frustration with having to wait on staff to get what he needs.  Patient Stated Goal: to get stronger and go back to his apartment.    Cognition  Cognition Arousal/Alertness: Awake/alert Behavior During Therapy: WFL for tasks assessed/performed Overall Cognitive Status: Within Functional Limits for tasks assessed    Balance  Balance Sitting-balance support: Bilateral upper extremity supported;Feet supported Sitting balance-Leahy Scale: Good Standing balance support: Bilateral upper extremity supported Standing balance-Leahy Scale: Poor  End of Session PT - End of Session Equipment Utilized During Treatment: Gait belt Activity Tolerance: Patient  limited by fatigue Patient  left: in chair;with call bell/phone within reach;with chair alarm set     Aigner Horseman B. [place], [place], DPT [phone]   03/28/2013, 2:07 PM

## 2013-03-28 NOTE — Discharge Summary (Signed)
Physician Discharge Summary  Colin Hudson YJW:[id number] DOB: Jan 26, 1919 DOA: 03/24/2013  PCP: [name], MD  Admit date: 03/24/2013 Discharge date: 03/28/2013  Time spent: 40 minutes  Recommendations for Outpatient Follow-up:  1. Followup with primary care physician within one week  Discharge Diagnoses:  Principal Problem:   Near syncope Active Problems:   NSTEMI (non-ST elevated myocardial infarction)   Thrombocytopenia   Anemia   Abdominal mass   Renal failure   Metabolic acidosis   Hypernatremia   Nausea vomiting and diarrhea   Discharge Condition: Stable  Diet recommendation: Heart healthy diet  Filed Weights   03/24/13 2149 03/27/13 0400  Weight: 78.5 kg (173 lb 1 oz) 79.243 kg (174 lb 11.2 oz)    History of present illness:  Colin Hudson is a 80 y.o. male with history of glaucoma, COPD, mitral regurgitation was brought to the ER after patient was found to be on the floor as found in his independent living facility. Patient states he has been having diarrhea with nausea and vomiting for the last few days. He was unable to keep anything. He was going to the bathroom when he fell. Patient was found conscious. He was found in the bathroom floor with stools around. Family feels that patient also was very confused initially. In the ER patient had lab works done which shows elevated troponin for which cardiologist was consulted. In addition labs also show hypernatremia, anemia and thrombocytopenia with renal failure. CT head did not show any acute. 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 Patient on exam is nonfocal. Has bruising in multiple areas. Complaints of pain on moving his left hip area. Denies any chest pain or shortness of breath. She states he feels very weak and wants to eat something.   Hospital Course:   1. Acute/subacute left parietal infarct: MRI of the brain was  done and showed possible punctate acute or subacute left parietal infarct, small subdural hygromas or chronic small vessel ischemic disease. Patient does not want to pursue any further management or intervention. Patient is intolerant to aspirin, placed on Plavix 75 mg daily. Lipid panel showed LDL of 69, he is already on statin and that is continued.  2. Non-STEMI/near-syncope: Presents with troponin of 1.1, cardiology consulted, troponin is trending down nicely, patient also does not want any aggressive intervention to be done, placed on metoprolol, losartan and Plavix. 2-D echocardiogram showed ejection fraction of 45-50% with grade 1 diastolic dysfunction. No diuretic at the time of discharge, patient might need further adjustment of medications as outpatient.  3. Abdominal masses/duodenal tumor: Incidentally found CT of abdomen, 3.7x2.5x3.8 cm mass extending from the posterior aspect of the duodenum suspicious for solid neoplasm. This is discussed by Dr. Tana Coast with the patient and his family and they do not want to pursue any further workup. Palliative medicine was called and they met with the family yesterday and confirmed the goals of care with his daughter. Patient is DO NOT RESUSCITATE, does not want workup for his delusional mass.  4. Acute renal failure: Presented with creatinine of 1.38 and BUN of 43 indicates dehydration, patient hydrated with IV fluids at the day of discharge creatinine is 1.22 and BUN is 28.  5. Metabolic acidosis: At the time of admission patient had sodium of 184, bicarbonate of 16, BUN/creatinine of 43/1.38 ALT is findings suggest dehydration with non-anion gap metabolic acidosis, this could be secondary to Diamox diuretic. Acetazolamide discontinue  the time of discharge.  Procedures:  None  Consultations:  [facility].  Palliative medicine team.  Discharge Exam: Filed Vitals:   03/28/13 0522  BP: 154/52  Pulse: 59  Temp: 98.6 F (37 C)  Resp: 18   General: Alert and awake, oriented x3, not in any acute distress. HEENT: anicteric sclera, pupils reactive to light and accommodation, EOMI CVS: S1-S2 clear, no murmur rubs or gallops Chest: clear to auscultation bilaterally, no wheezing, rales or rhonchi Abdomen: soft nontender, nondistended, normal bowel sounds, no organomegaly Extremities: no cyanosis, clubbing or edema noted bilaterally Neuro: Cranial nerves II-XII intact, no focal neurological deficits  Discharge Instructions      Discharge Orders   Future Orders Complete By Expires   Diet - low sodium heart healthy  As directed    Increase activity slowly  As directed        Medication List    STOP taking these medications       acetaZOLAMIDE 250 MG tablet  Commonly known as:  DIAMOX      TAKE these medications       clopidogrel 75 MG tablet  Commonly known as:  PLAVIX  Take 1 tablet (75 mg total) by mouth daily with breakfast.     losartan 25 MG tablet  Commonly known as:  COZAAR  Take 1 tablet (25 mg total) by mouth daily.     metoprolol tartrate 25 MG tablet  Commonly known as:  LOPRESSOR  Take 1 tablet (25 mg total) by mouth 2 (two) times daily.     pilocarpine 4 % ophthalmic solution  Commonly known as:  PILOCAR  Place 1 drop into both eyes 4 (four) times daily.     SYSTANE 0.4-0.3 % Soln  Generic drug:  Polyethyl Glycol-Propyl Glycol  Apply 1 drop to eye daily as needed (for dry eyes).     tamsulosin 0.4 MG Caps capsule  Commonly known as:  FLOMAX  Take 1 capsule (0.4 mg total) by mouth daily.     travoprost (benzalkonium) 0.004 % ophthalmic solution  Commonly known as:  TRAVATAN  Place 1 drop into both eyes at bedtime.       Allergies  Allergen Reactions  . Aspirin Swelling    Face and eyes   Follow-up Information   Follow up with [name], MD In 1 week.   Specialty:  Internal Medicine   Contact information:   [address] [place] [address] [phone]         The results of significant diagnostics from this hospitalization (including imaging, microbiology, ancillary and laboratory) are listed below for reference.    Significant Diagnostic Studies: Dg Chest 2 View  03/24/2013   CLINICAL DATA:  Altered mental status.  Shortness of breath.  EXAM: CHEST  2 VIEW  COMPARISON:  CT chest 09/16/2012 and PA and lateral chest 12/11/2012.  FINDINGS: Calcified pleural plaques are again seen. The lungs are clear. Heart size is normal. No pneumothorax or pleural effusion.  IMPRESSION: No acute finding.  Stable compared to prior exams.   Electronically Signed   By: Inge Rise M.D.   On: 03/24/2013 16:39   Ct Head Wo Contrast  03/24/2013   CLINICAL DATA:  Fall, head trauma  EXAM: CT HEAD WITHOUT CONTRAST  TECHNIQUE: Contiguous axial images were obtained from the base of the skull through the vertex without intravenous contrast.  COMPARISON:  None.  FINDINGS: No intracranial hemorrhage. No parenchymal contusion. No midline shift or mass effect.  Basilar cisterns are patent. No skull base fracture. No fluid in the paranasal sinuses or mastoid air cells. There is extra-axial fluid collection anterior to the right frontal lobe is low-density most suggestive of a chronic hygroma. No midline shift or mass effect from this fluid collection.  Visualized cortical atrophy. There is periventricular subcortical white matter hypodensities.  No evidence of skull fracture. No fluid the paranasal sinuses or mastoid air cells.  IMPRESSION: 1. No intracranial trauma. 2. A chronic low-density extra-axial fluid collection anterior to the right frontal lobe likely represents hygroma. 3. Atrophy and microvascular disease. 4. No skull fracture.   Electronically Signed   By: Suzy Bouchard M.D.   On: 03/24/2013 17:59   Mr Brain Wo Contrast  03/26/2013   CLINICAL DATA:  Fall.  Found down.  Evaluate for stroke.  EXAM: MRI HEAD WITHOUT CONTRAST  TECHNIQUE: Multiplanar, multiecho pulse  sequences of the brain and surrounding structures were obtained without intravenous contrast.  COMPARISON:  Head CT 03/24/2013  FINDINGS: There is a punctate focus of increased diffusion weighted signal within the left parietal cortex (series 4, image 23). Restricted diffusion is not definitely identified on the ADC map, although evaluation is limited by its small size. There is no other evidence of acute infarct. Mildly prominent CSF collections are present over the right greater than left frontal lobes. There is no evidence of intracranial hemorrhage. Patchy T2 hyperintensities are present within the subcortical and deep cerebral white matter bilaterally, nonspecific but compatible with moderate chronic small vessel ischemic disease. There is no evidence of mass or midline shift. There is moderate cerebral atrophy.  Prior left cataract surgery is noted. Major intracranial vascular flow voids are unremarkable. Minimal left maxillary sinus mucosal thickening is noted. Mastoid air cells are clear.  IMPRESSION: 1. Possible punctate acute or subacute left parietal infarct. 2. Mildly prominent CSF spaces over the right greater than left frontal lobes could reflect small subdural hygromas. 3. Moderate chronic small vessel ischemic disease.   Electronically Signed   By: Logan Bores   On: 03/26/2013 10:21   Ct Abdomen Pelvis W Contrast  03/24/2013   CLINICAL DATA:  Diarrhea and abdominal pain.  EXAM: CT ABDOMEN AND PELVIS WITH CONTRAST  TECHNIQUE: Multidetector CT imaging of the abdomen and pelvis was performed using the standard protocol following bolus administration of intravenous contrast.  CONTRAST:  56m OMNIPAQUE IOHEXOL 300 MG/ML  SOLN  COMPARISON:  CT of the abdomen and pelvis 06/06/2008.  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 Severe calcifications of the aortic valve. Atherosclerotic calcifications throughout  the right coronary artery.  Abdomen/Pelvis: The appearance of the liver, gallbladder, pancreas, spleen and bilateral adrenal glands is unremarkable. Sub cm low-attenuation lesions in the left kidney are too small to definitively characterize. Mild atrophy of the right kidney. There are 2 simple cysts in the right kidney, largest of which measures 2.6 cm in diameter extending exophytically off the posterior aspect of the lower pole.  Image 38 of series 3 demonstrates a well-circumscribed ovoid shaped mass measuring 3.7 x 2.5 x 3.8 cm extending off the posterior aspect of the proximal small bowel at the junction of the second and third portions of the duodenum, which measures approximately 49 HU on early phase postcontrast images, increasing to 65 HU on more delayed phase postcontrast imaging, suspicious for a solid neoplasm such as a GI stromal tumor. This is new compared to prior study 06/06/2008.  Extensive atherosclerosis throughout the abdominal and pelvic vasculature, without evidence of aneurysm or dissection. There are numerous colonic diverticulae, particularly in the region of the sigmoid colon, without surrounding inflammatory changes to suggest an acute diverticulitis at this time. Normal appendix. No significant volume of ascites. No pneumoperitoneum. No pathologic distention of small bowel. No definite lymphadenopathy identified within the abdomen or pelvis. In the posterior aspect of the urinary bladder there is a 6 mm calcification, most compatible with a bladder calculus. Prostate gland is unremarkable in appearance.  Musculoskeletal: There are no aggressive appearing lytic or blastic lesions noted in the visualized portions of the skeleton.  IMPRESSION: 1. No definite acute findings to account for the patient's symptoms. 2. However, there is a new 3.7 x 2.5 x 3.8 cm mass extending off the posterior aspect of the duodenum, which has imaging characteristics suspicious for a solid neoplasm such as a  small GI stromal tumor (GIST). Correlation with EGD and biopsy is recommended if clinically appropriate. 3. Extensive colonic diverticulosis without findings to suggest acute diverticulitis at this time. 4. Severe atherosclerosis. 5. 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 6. Severe calcifications of the aortic valve. Echocardiographic correlation for evidence of underlying valvular dysfunction may be warranted if clinically appropriate. 7. 6 mm calculus lying dependently in the urinary bladder. 8. Additional incidental findings, as above.   Electronically Signed   By: Vinnie Langton M.D.   On: 03/24/2013 18:08   Dg Pelvis Portable  03/25/2013   CLINICAL DATA:  Left hip pain  EXAM: PORTABLE PELVIS 1-2 VIEWS  COMPARISON:  CT ABD/PELVIS W CM dated 03/24/2013  FINDINGS: Hips are located. No evidence of pelvic fracture or sacral fracture.  IMPRESSION: No evidence of hip fracture.   Electronically Signed   By: Suzy Bouchard M.D.   On: 03/25/2013 00:10    Microbiology: No results found for this or any previous visit (from the past 240 hour(s)).   Labs: Basic Metabolic Panel:  Recent Labs Lab 03/24/13 1619 03/25/13 0545 03/26/13 0450 03/27/13 0555 03/28/13 0422  NA 148* 141 142 137 140  K 3.8 3.9 3.5* 3.5* 3.6*  CL 115* 113* 109 106 107  CO2 16* 15* 21 21 24   GLUCOSE 105* 103* 98 95 92  BUN 43* 37* 28* 28* 28*  CREATININE 1.38* 1.27 1.27 1.22 1.23  CALCIUM 9.1 8.3* 8.5 8.6 8.4   Liver Function Tests:  Recent Labs Lab 03/24/13 1619 03/25/13 0545  AST 106* 83*  ALT 36 32  ALKPHOS 105 85  BILITOT 0.3 0.3  PROT 7.8 6.7  ALBUMIN 3.7 3.1*    Recent Labs Lab 03/24/13 1619  LIPASE 14   No results found for this basename: AMMONIA,  in the last 168 hours CBC:  Recent Labs Lab 03/24/13 1619 03/25/13 0545 03/26/13 0658 03/27/13 0555 03/28/13 0422  WBC 10.2 9.3 7.3 5.2 4.3  NEUTROABS 6.9 6.3  --   --    --   HGB 12.7* 11.0* 10.6* 10.2* 10.2*  HCT 37.9* 32.9* 31.2* 30.4* 30.5*  MCV 96.4 96.8 96.0 96.2 95.9  PLT 97* 85* 76* 77* 87*   Cardiac Enzymes:  Recent Labs Lab 03/24/13 1725 03/24/13 2250 03/25/13 0542 03/25/13 0545 03/25/13 0946  CKTOTAL 1893*  --   --  1170*  --   TROPONINI 1.12* 0.96* 0.95*  --  0.76*   BNP: BNP (last 3 results) No results found for this basename: PROBNP,  in the last  8760 hours CBG: No results found for this basename: GLUCAP,  in the last 168 hours     Signed:  Nikeshia Keetch A  [facility] 03/28/2013, 10:39 AM

## 2013-03-28 NOTE — Progress Notes (Signed)
Reviewed discharge instructions with patient's daughter and she stated her understanding.  Report called to nurse at [facility], nurse informed patient has not had bowel movement since admission and is requesting something once he gets to the facility.  Patient transported via [place].  Sanda Linger

## 2013-03-28 NOTE — Progress Notes (Signed)
CSW (Clinical Social Worker) prepared pt dc packet and placed with shadow chart. CSW arranged non-emergent ambulance transport. Pt, pt family, pt nurse, and facility informed. CSW signing off.  Elton Catalano, LCSWA [phone]  

## 2013-03-29 ENCOUNTER — Encounter: Payer: Self-pay | Admitting: Nurse Practitioner

## 2013-03-29 ENCOUNTER — Non-Acute Institutional Stay (SKILLED_NURSING_FACILITY): Payer: Medicare Other | Admitting: Nurse Practitioner

## 2013-03-29 DIAGNOSIS — I359 Nonrheumatic aortic valve disorder, unspecified: Secondary | ICD-10-CM

## 2013-03-29 DIAGNOSIS — D649 Anemia, unspecified: Secondary | ICD-10-CM

## 2013-03-29 DIAGNOSIS — I6350 Cerebral infarction due to unspecified occlusion or stenosis of unspecified cerebral artery: Secondary | ICD-10-CM

## 2013-03-29 DIAGNOSIS — R1900 Intra-abdominal and pelvic swelling, mass and lump, unspecified site: Secondary | ICD-10-CM

## 2013-03-29 DIAGNOSIS — I639 Cerebral infarction, unspecified: Secondary | ICD-10-CM

## 2013-03-29 DIAGNOSIS — I214 Non-ST elevation (NSTEMI) myocardial infarction: Secondary | ICD-10-CM

## 2013-03-29 DIAGNOSIS — E872 Acidosis, unspecified: Secondary | ICD-10-CM

## 2013-03-29 DIAGNOSIS — N19 Unspecified kidney failure: Secondary | ICD-10-CM

## 2013-03-29 DIAGNOSIS — E870 Hyperosmolality and hypernatremia: Secondary | ICD-10-CM

## 2013-03-29 NOTE — Progress Notes (Signed)
Patient ID: Colin Hudson, male   DOB: [birth year], 80 y.o.   MRN: [id number]   Code Status: DNR  Allergies  Allergen Reactions  . Aspirin Swelling    Face and eyes    Chief Complaint  Patient presents with  . Medical Managment of Chronic Issues    Palliative care  . Hospitalization Follow-up    HPI: Patient is a 80 y.o. male seen in the SNF at [facility] today for evaluation of s/p hospital stay for Syncope,  and other chronic medical conditions. He was hospitalized from 03/24/2013 to 03/28/2013 for Near syncope /NSTEMI (non-ST elevated myocardial infarction) Thrombocytopenia Anemia Abdominal mass Renal failure Metabolic acidosis Hypernatremia Nausea vomiting and diarrhea(resolved)   He has a history of glaucoma, COPD, mitral regurgitation was brought to the ER after patient was found to be on the floor as found in his independent living facility. Patient states he has been having diarrhea with nausea and vomiting for the last few days. He was unable to keep anything. He was going to the bathroom when he fell. Patient was found conscious. He was found in the bathroom floor with stools around. Family feels that patient also was very confused initially. In the ER patient had lab works done which shows elevated troponin for which cardiologist was consulted. In addition labs also show hypernatremia, anemia and thrombocytopenia with renal failure. CT head did not show any acute. 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 Patient on exam is nonfocal. Has bruising in multiple areas. Complaints of pain on moving his left hip area.   Problem List Items Addressed This Visit   Renal failure     Acute renal failure: Presented with creatinine of 1.38 and BUN of 43 indicates dehydration, patient hydrated with IV fluids at the day of discharge creatinine is 1.22 and BUN is 28.     NSTEMI (non-ST elevated  myocardial infarction)     Non-STEMI/near-syncope: Presents with troponin of 1.1, cardiology consulted, troponin is trending down nicely, patient also does not want any aggressive intervention to be done, placed on metoprolol, losartan and Plavix. 2-D echocardiogram showed ejection fraction of 45-50% with grade 1 diastolic dysfunction. No diuretic at the time of discharge     Metabolic acidosis     At the time of hospital admission patient had sodium of 184, bicarbonate of 16, BUN/creatinine of 43/1.38 ALT is findings suggest dehydration with non-anion gap metabolic acidosis, this could be secondary to Diamox diuretic. Acetazolamide discontinue the time of discharge.      Hypernatremia     Resolved after re-hydrated and discontinued Diamox,Na 140 upon hospital discharge.      Calcification of aortic valve     03/24/13 CT abd showed Severe atherosclerosis. Severe calcifications of the aortic valve.    Anemia     New baseline Hgb is 10s.     Acute CVA (cerebrovascular accident) - Primary     Acute/subacute left parietal infarct:  MRI of the brain was done and showed possible punctate acute or subacute left parietal infarct, small subdural hygromas or chronic small vessel ischemic disease. Patient does not want to pursue any further management or intervention. Patient is intolerant to aspirin, placed on Plavix 75 mg daily. Lipid panel showed LDL of 69     Abdominal mass     Abdominal masses/duodenal tumor: Incidentally found CT of abdomen, 3.7x2.5x3.8 cm mass extending from the posterior aspect of the  duodenum suspicious for solid neoplasm. This is discussed by Dr. Tana Coast with the patient and his family and they do not want to pursue any further workup. Palliative consulted. Patient is DO NOT RESUSCITATE, does not want workup for his duodenal mass.         Review of Systems:  Review of Systems  Constitutional: Negative for fever, chills, weight loss, malaise/fatigue and diaphoresis.        Generalized, w/c for mobility.   HENT: Positive for hearing loss. Negative for congestion, ear discharge, ear pain, nosebleeds, sore throat and tinnitus.   Eyes: Negative for blurred vision, double vision, photophobia, pain, discharge and redness.  Respiratory: Negative for cough, hemoptysis, sputum production, shortness of breath, wheezing and stridor.   Cardiovascular: Negative for chest pain, palpitations, orthopnea, claudication, leg swelling and PND.  Gastrointestinal: Negative for heartburn, nausea, vomiting, abdominal pain, diarrhea, constipation, blood in stool and melena.  Genitourinary: Negative for dysuria, urgency, frequency, hematuria and flank pain.  Musculoskeletal: Positive for falls. Negative for back pain, joint pain, myalgias and neck pain.  Skin: Negative for itching and rash.  Neurological: Positive for weakness. Negative for dizziness, tingling, tremors, sensory change, speech change, focal weakness, seizures, loss of consciousness and headaches.       Syncope resulted in hospital admission 03/2013.   Endo/Heme/Allergies: Negative for environmental allergies and polydipsia. Bruises/bleeds easily.  Psychiatric/Behavioral: Positive for memory loss. Negative for depression, suicidal ideas, hallucinations and substance abuse. The patient is not nervous/anxious and does not have insomnia.      Past Medical History  Diagnosis Date  . Pancytopenia     caused by medications he is no longer taking  . Glaucoma   . COPD (chronic obstructive pulmonary disease)   . Mitral regurgitation   . Gout   . Colon polyps   . Cancer     skin  . Shingles   . Syncope   . Onychomycosis     fungal  . Shoulder fracture    Past Surgical History  Procedure Laterality Date  . Tonsillectomy    . Adenoidectomy    . Eye surgery      laser-glaucoma  . Eye surgery      cataract   Social History:   reports that he has quit smoking. He does not have any smokeless tobacco history on file. He  reports that he does not drink alcohol. His drug history is not on file.  Family History  Problem Relation Age of Onset  . CAD Brother     Medications: Patient's Medications  New Prescriptions   No medications on file  Previous Medications   CLOPIDOGREL (PLAVIX) 75 MG TABLET    Take 1 tablet (75 mg total) by mouth daily with breakfast.   LOSARTAN (COZAAR) 25 MG TABLET    Take 1 tablet (25 mg total) by mouth daily.   METOPROLOL TARTRATE (LOPRESSOR) 25 MG TABLET    Take 1 tablet (25 mg total) by mouth 2 (two) times daily.   PILOCARPINE (PILOCAR) 4 % OPHTHALMIC SOLUTION    Place 1 drop into both eyes 4 (four) times daily.   POLYETHYL GLYCOL-PROPYL GLYCOL (SYSTANE) 0.4-0.3 % SOLN    Apply 1 drop to eye daily as needed (for dry eyes).   TAMSULOSIN (FLOMAX) 0.4 MG CAPS CAPSULE    Take 1 capsule (0.4 mg total) by mouth daily.   TRAVOPROST, BENZALKONIUM, (TRAVATAN) 0.004 % OPHTHALMIC SOLUTION    Place 1 drop into both eyes at bedtime.  Modified Medications  No medications on file  Discontinued Medications   No medications on file     Physical Exam: Physical Exam  Constitutional: He is oriented to person, place, and time. He appears well-developed and well-nourished. No distress.  HENT:  Head: Normocephalic and atraumatic.  Right Ear: External ear normal.  Left Ear: External ear normal.  Nose: Nose normal.  Mouth/Throat: Oropharynx is clear and moist. No oropharyngeal exudate.  Eyes: Conjunctivae and EOM are normal. Pupils are equal, round, and reactive to light. Right eye exhibits no discharge. Left eye exhibits no discharge. No scleral icterus.  Neck: Normal range of motion. Neck supple. No JVD present. No tracheal deviation present. No thyromegaly present.  Cardiovascular: Normal rate, regular rhythm and intact distal pulses.   Murmur heard. 3/6 loudest is at the right sternal boarder.   Pulmonary/Chest: Effort normal and breath sounds normal. No stridor. No respiratory distress.  He has no wheezes. He has no rales. He exhibits no tenderness.  Abdominal: Soft. Bowel sounds are normal. He exhibits mass. He exhibits no distension. There is no tenderness. There is no rebound and no guarding.  Identify on CT abd 03/2013 while in hospital as a incidental finding.   Musculoskeletal: Normal range of motion. He exhibits no edema and no tenderness.  Lymphadenopathy:    He has no cervical adenopathy.  Neurological: He is alert and oriented to person, place, and time. He displays normal reflexes. No cranial nerve deficit. He exhibits normal muscle tone. Coordination normal.  Skin: Skin is warm and dry. No rash noted. He is not diaphoretic. No erythema. No pallor.  Psychiatric: He has a normal mood and affect. His behavior is normal.  Memory lapses.     Filed Vitals:   03/29/13 1253  BP: 138/68  Pulse: 70  Temp: 98.4 F (36.9 C)  TempSrc: Tympanic  Resp: 20      Labs reviewed: Basic Metabolic Panel:  Recent Labs  03/24/13 1619 03/24/13 2250  03/26/13 0450 03/27/13 0555 03/28/13 0422  NA 148*  --   < > 142 137 140  K 3.8  --   < > 3.5* 3.5* 3.6*  CL 115*  --   < > 109 106 107  CO2 16*  --   < > 21 21 24   GLUCOSE 105*  --   < > 98 95 92  BUN 43*  --   < > 28* 28* 28*  CREATININE 1.38*  --   < > 1.27 1.22 1.23  CALCIUM 9.1  --   < > 8.5 8.6 8.4  TSH  --  2.456  --   --   --   --   < > = values in this interval not displayed. Liver Function Tests:  Recent Labs  03/24/13 1619 03/25/13 0545  AST 106* 83*  ALT 36 32  ALKPHOS 105 85  BILITOT 0.3 0.3  PROT 7.8 6.7  ALBUMIN 3.7 3.1*    Recent Labs  03/24/13 1619  LIPASE 14   CBC:  Recent Labs  03/24/13 1619 03/25/13 0545 03/26/13 0658 03/27/13 0555 03/28/13 0422  WBC 10.2 9.3 7.3 5.2 4.3  NEUTROABS 6.9 6.3  --   --   --   HGB 12.7* 11.0* 10.6* 10.2* 10.2*  HCT 37.9* 32.9* 31.2* 30.4* 30.5*  MCV 96.4 96.8 96.0 96.2 95.9  PLT 97* 85* 76* 77* 87*   Lipid Panel:  Recent Labs   03/25/13 0545  CHOL 122  HDL 41  LDLCALC 69  TRIG 58  CHOLHDL 3.0    Past Procedures:   03/24/2013 CLINICAL DATA: Altered mental status. Shortness of breath. EXAM: CHEST 2 VIEW IMPRESSION: No acute finding. Stable compared to prior exams.   03/24/2013 CLINICAL DATA: Fall, head trauma EXAM: CT HEAD WITHOUT CONTRAST IMPRESSION: 1. No intracranial trauma. 2. A chronic low-density extra-axial fluid collection anterior to the right frontal lobe likely represents hygroma. 3. Atrophy and microvascular disease. 4. No skull fracture.   03/26/2013 CLINICAL DATA: Fall. Found down. Evaluate for stroke. EXAM: MRI HEAD WITHOUT CONTRAST IMPRESSION: 1. Possible punctate acute or subacute left parietal infarct. 2. Mildly prominent CSF spaces over the right greater than left frontal lobes could reflect small subdural hygromas. 3. Moderate chronic small vessel ischemic disease.   03/24/2013 CLINICAL DATA: Diarrhea and abdominal pain. EXAM: CT ABDOMEN AND PELVIS WITH CONTRAST IMPRESSION: 1. No definite acute findings to account for the patient's symptoms. 2. However, there is a new 3.7 x 2.5 x 3.8 cm mass extending off the posterior aspect of the duodenum, which has imaging characteristics suspicious for a solid neoplasm such as a small GI stromal tumor (GIST). Correlation with EGD and biopsy is recommended if clinically appropriate. 3. Extensive colonic diverticulosis without findings to suggest acute diverticulitis at this time. 4. Severe atherosclerosis. 5. 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 6. Severe calcifications of the aortic valve. Echocardiographic correlation for evidence of underlying valvular dysfunction may be warranted if clinically appropriate. 7. 6 mm calculus lying dependently in the urinary bladder. 8. Additional incidental findings  03/25/2013 CLINICAL DATA: Left hip pain EXAM: PORTABLE PELVIS 1-2 VIEWS IMPRESSION: No  evidence of hip fracture.   Assessment/Plan Acute CVA (cerebrovascular accident) Acute/subacute left parietal infarct:  MRI of the brain was done and showed possible punctate acute or subacute left parietal infarct, small subdural hygromas or chronic small vessel ischemic disease. Patient does not want to pursue any further management or intervention. Patient is intolerant to aspirin, placed on Plavix 75 mg daily. Lipid panel showed LDL of 69   NSTEMI (non-ST elevated myocardial infarction) Non-STEMI/near-syncope: Presents with troponin of 1.1, cardiology consulted, troponin is trending down nicely, patient also does not want any aggressive intervention to be done, placed on metoprolol, losartan and Plavix. 2-D echocardiogram showed ejection fraction of 45-50% with grade 1 diastolic dysfunction. No diuretic at the time of discharge   Abdominal mass Abdominal masses/duodenal tumor: Incidentally found CT of abdomen, 3.7x2.5x3.8 cm mass extending from the posterior aspect of the duodenum suspicious for solid neoplasm. This is discussed by Dr. Tana Coast with the patient and his family and they do not want to pursue any further workup. Palliative consulted. Patient is DO NOT RESUSCITATE, does not want workup for his duodenal mass.    Renal failure Acute renal failure: Presented with creatinine of 1.38 and BUN of 43 indicates dehydration, patient hydrated with IV fluids at the day of discharge creatinine is 1.22 and BUN is 28.   Metabolic acidosis At the time of hospital admission patient had sodium of 184, bicarbonate of 16, BUN/creatinine of 43/1.38 ALT is findings suggest dehydration with non-anion gap metabolic acidosis, this could be secondary to Diamox diuretic. Acetazolamide discontinue the time of discharge.    Hypernatremia Resolved after re-hydrated and discontinued Diamox,Na 140 upon hospital discharge.    Anemia New baseline Hgb is 10s.   Calcification of aortic valve 03/24/13 CT abd  showed Severe atherosclerosis. Severe calcifications of the aortic valve.    Family/ Staff Communication:  observe the patient.   Goals of Care: SNF  Labs/tests ordered: none

## 2013-03-29 NOTE — Assessment & Plan Note (Signed)
Resolved after re-hydrated and discontinued Diamox,Na 140 upon hospital discharge.

## 2013-03-29 NOTE — Assessment & Plan Note (Signed)
Acute/subacute left parietal infarct:  MRI of the brain was done and showed possible punctate acute or subacute left parietal infarct, small subdural hygromas or chronic small vessel ischemic disease. Patient does not want to pursue any further management or intervention. Patient is intolerant to aspirin, placed on Plavix 75 mg daily. Lipid panel showed LDL of 69

## 2013-03-29 NOTE — Assessment & Plan Note (Signed)
New baseline Hgb is 10s.

## 2013-03-29 NOTE — Assessment & Plan Note (Signed)
Abdominal masses/duodenal tumor: Incidentally found CT of abdomen, 3.7x2.5x3.8 cm mass extending from the posterior aspect of the duodenum suspicious for solid neoplasm. This is discussed by Dr. Tana Coast with the patient and his family and they do not want to pursue any further workup. Palliative consulted. Patient is DO NOT RESUSCITATE, does not want workup for his duodenal mass.

## 2013-03-29 NOTE — Assessment & Plan Note (Signed)
03/24/13 CT abd showed Severe atherosclerosis. Severe calcifications of the aortic valve.

## 2013-03-29 NOTE — Assessment & Plan Note (Signed)
Non-STEMI/near-syncope: Presents with troponin of 1.1, cardiology consulted, troponin is trending down nicely, patient also does not want any aggressive intervention to be done, placed on metoprolol, losartan and Plavix. 2-D echocardiogram showed ejection fraction of 45-50% with grade 1 diastolic dysfunction. No diuretic at the time of discharge

## 2013-03-29 NOTE — Assessment & Plan Note (Addendum)
At the time of hospital admission patient had sodium of 184, bicarbonate of 16, BUN/creatinine of 43/1.38 ALT is findings suggest dehydration with non-anion gap metabolic acidosis, this could be secondary to Diamox diuretic. Acetazolamide discontinue the time of discharge.

## 2013-03-29 NOTE — Assessment & Plan Note (Signed)
Acute renal failure: Presented with creatinine of 1.38 and BUN of 43 indicates dehydration, patient hydrated with IV fluids at the day of discharge creatinine is 1.22 and BUN is 28.

## 2013-04-11 LAB — CBC AND DIFFERENTIAL
HCT: 31 % — AB (ref 41–53)
Hemoglobin: 10.2 g/dL — AB (ref 13.5–17.5)
Platelets: 162 10*3/uL (ref 150–399)
WBC: 2.5 10*3/mL

## 2013-04-11 LAB — BASIC METABOLIC PANEL
BUN: 22 mg/dL — AB (ref 4–21)
Creatinine: 1.4 mg/dL — AB (ref 0.6–1.3)
GLUCOSE: 88 mg/dL
Potassium: 4.8 mmol/L (ref 3.4–5.3)
Sodium: 136 mmol/L — AB (ref 137–147)

## 2013-04-30 ENCOUNTER — Encounter: Payer: Self-pay | Admitting: Nurse Practitioner

## 2013-04-30 ENCOUNTER — Non-Acute Institutional Stay: Payer: Medicare Other | Admitting: Nurse Practitioner

## 2013-04-30 DIAGNOSIS — I639 Cerebral infarction, unspecified: Secondary | ICD-10-CM

## 2013-04-30 DIAGNOSIS — N19 Unspecified kidney failure: Secondary | ICD-10-CM

## 2013-04-30 DIAGNOSIS — N400 Enlarged prostate without lower urinary tract symptoms: Secondary | ICD-10-CM

## 2013-04-30 DIAGNOSIS — I6350 Cerebral infarction due to unspecified occlusion or stenosis of unspecified cerebral artery: Secondary | ICD-10-CM

## 2013-04-30 DIAGNOSIS — I214 Non-ST elevation (NSTEMI) myocardial infarction: Secondary | ICD-10-CM

## 2013-04-30 DIAGNOSIS — D696 Thrombocytopenia, unspecified: Secondary | ICD-10-CM

## 2013-04-30 DIAGNOSIS — D649 Anemia, unspecified: Secondary | ICD-10-CM

## 2013-04-30 DIAGNOSIS — R1900 Intra-abdominal and pelvic swelling, mass and lump, unspecified site: Secondary | ICD-10-CM

## 2013-04-30 DIAGNOSIS — E872 Acidosis, unspecified: Secondary | ICD-10-CM

## 2013-04-30 NOTE — Assessment & Plan Note (Signed)
No abd symptoms.

## 2013-04-30 NOTE — Assessment & Plan Note (Signed)
Last Bun/creat 22/1.43 04/11/13

## 2013-04-30 NOTE — Assessment & Plan Note (Signed)
New baseline Hgb is 10s. Hgb 10.2 04/11/13

## 2013-04-30 NOTE — Assessment & Plan Note (Signed)
No angina since last visit.

## 2013-04-30 NOTE — Assessment & Plan Note (Signed)
Na 136, Bun/creat 22/1.43, Co2 30 04/11/13

## 2013-04-30 NOTE — Assessment & Plan Note (Signed)
Resolved, plt 162 04/11/13

## 2013-04-30 NOTE — Assessment & Plan Note (Signed)
No urinary retention, takes Flomax.

## 2013-04-30 NOTE — Assessment & Plan Note (Signed)
Continue ASA and Plavix.

## 2013-04-30 NOTE — Progress Notes (Signed)
Patient ID: Colin Hudson, male   DOB: Aug 16, 1918, 80 y.o.   MRN: [id number]   Code Status: DNR  Allergies  Allergen Reactions  . Aspirin Swelling    Face and eyes    Chief Complaint  Patient presents with  . Medical Managment of Chronic Issues    HPI: Patient is a 80 y.o. male seen in the AL at [facility] today for evaluation of chronic medical conditions.    He was hospitalized from 03/24/2013 to 03/28/2013 for Near syncope /NSTEMI (non-ST elevated myocardial infarction) Thrombocytopenia Anemia Abdominal mass Renal failure Metabolic acidosis Hypernatremia Nausea vomiting and diarrhea(resolved)   He has a history of glaucoma, COPD, mitral regurgitation was brought to the ER after patient was found to be on the floor as found in his independent living facility. Patient states he has been having diarrhea with nausea and vomiting for the last few days. He was unable to keep anything. He was going to the bathroom when he fell. Patient was found conscious. He was found in the bathroom floor with stools around. Family feels that patient also was very confused initially. In the ER patient had lab works done which shows elevated troponin for which cardiologist was consulted. In addition labs also show hypernatremia, anemia and thrombocytopenia with renal failure. CT head did not show any acute. 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  Problem List Items Addressed This Visit   Abdominal mass     No abd symptoms.     Acute CVA (cerebrovascular accident)     Continue ASA and Plavix.    Anemia     New baseline Hgb is 10s. Hgb 10.2 04/11/13     BPH (benign prostatic hyperplasia)     No urinary retention, takes Flomax.     Metabolic acidosis     Na 010, Bun/creat 22/1.43, Co2 30 04/11/13    NSTEMI (non-ST elevated myocardial infarction)     No angina since last visit.    Renal failure     Last  Bun/creat 22/1.43 04/11/13    Thrombocytopenia - Primary     Resolved, plt 162 04/11/13       Review of Systems:  Review of Systems  Constitutional: Negative for fever, chills, weight loss, malaise/fatigue and diaphoresis.       Generalized, w/c for mobility.   HENT: Positive for hearing loss. Negative for congestion, ear discharge, ear pain, nosebleeds, sore throat and tinnitus.   Eyes: Negative for blurred vision, double vision, photophobia, pain, discharge and redness.  Respiratory: Negative for cough, hemoptysis, sputum production, shortness of breath, wheezing and stridor.   Cardiovascular: Negative for chest pain, palpitations, orthopnea, claudication, leg swelling and PND.  Gastrointestinal: Negative for heartburn, nausea, vomiting, abdominal pain, diarrhea, constipation, blood in stool and melena.  Genitourinary: Negative for dysuria, urgency, frequency, hematuria and flank pain.  Musculoskeletal: Positive for falls. Negative for back pain, joint pain, myalgias and neck pain.  Skin: Negative for itching and rash.  Neurological: Positive for weakness. Negative for dizziness, tingling, tremors, sensory change, speech change, focal weakness, seizures, loss of consciousness and headaches.       Syncope resulted in hospital admission 03/2013.   Endo/Heme/Allergies: Negative for environmental allergies and polydipsia. Bruises/bleeds easily.  Psychiatric/Behavioral: Positive for memory loss. Negative for depression, suicidal ideas, hallucinations and substance abuse. The patient is not nervous/anxious and does not have insomnia.      Past Medical History  Diagnosis Date  . Pancytopenia     caused by medications he is no longer taking  . Glaucoma   . COPD (chronic obstructive pulmonary disease)   . Mitral regurgitation   . Gout   . Colon polyps   . Cancer     skin  . Shingles   . Syncope   . Onychomycosis     fungal  . Shoulder fracture    Past Surgical History  Procedure  Laterality Date  . Tonsillectomy    . Adenoidectomy    . Eye surgery      laser-glaucoma  . Eye surgery      cataract   Social History:   reports that he has quit smoking. He does not have any smokeless tobacco history on file. He reports that he does not drink alcohol. His drug history is not on file.  Family History  Problem Relation Age of Onset  . CAD Brother     Medications: Patient's Medications  New Prescriptions   No medications on file  Previous Medications   CLOPIDOGREL (PLAVIX) 75 MG TABLET    Take 1 tablet (75 mg total) by mouth daily with breakfast.   LOSARTAN (COZAAR) 25 MG TABLET    Take 1 tablet (25 mg total) by mouth daily.   METOPROLOL TARTRATE (LOPRESSOR) 25 MG TABLET    Take 1 tablet (25 mg total) by mouth 2 (two) times daily.   PILOCARPINE (PILOCAR) 4 % OPHTHALMIC SOLUTION    Place 1 drop into both eyes 4 (four) times daily.   POLYETHYL GLYCOL-PROPYL GLYCOL (SYSTANE) 0.4-0.3 % SOLN    Apply 1 drop to eye daily as needed (for dry eyes).   TAMSULOSIN (FLOMAX) 0.4 MG CAPS CAPSULE    Take 1 capsule (0.4 mg total) by mouth daily.   TRAVOPROST, BENZALKONIUM, (TRAVATAN) 0.004 % OPHTHALMIC SOLUTION    Place 1 drop into both eyes at bedtime.  Modified Medications   No medications on file  Discontinued Medications   No medications on file     Physical Exam: Physical Exam  Constitutional: He is oriented to person, place, and time. He appears well-developed and well-nourished. No distress.  HENT:  Head: Normocephalic and atraumatic.  Right Ear: External ear normal.  Left Ear: External ear normal.  Nose: Nose normal.  Mouth/Throat: Oropharynx is clear and moist. No oropharyngeal exudate.  Eyes: Conjunctivae and EOM are normal. Pupils are equal, round, and reactive to light. Right eye exhibits no discharge. Left eye exhibits no discharge. No scleral icterus.  Neck: Normal range of motion. Neck supple. No JVD present. No tracheal deviation present. No thyromegaly  present.  Cardiovascular: Normal rate, regular rhythm and intact distal pulses.   Murmur heard. 3/6 loudest is at the right sternal boarder.   Pulmonary/Chest: Effort normal and breath sounds normal. No stridor. No respiratory distress. He has no wheezes. He has no rales. He exhibits no tenderness.  Abdominal: Soft. Bowel sounds are normal. He exhibits mass. He exhibits no distension. There is no tenderness. There is no rebound and no guarding.  Identify on CT abd 03/2013 while in hospital as a incidental finding.   Musculoskeletal: Normal range of motion. He exhibits no edema and no tenderness.  Lymphadenopathy:    He has no cervical adenopathy.  Neurological: He is alert and oriented to person, place, and time. He displays normal reflexes. No cranial nerve deficit. He exhibits normal muscle tone. Coordination normal.  Skin: Skin is warm and dry. No rash noted. He is  not diaphoretic. No erythema. No pallor.  Psychiatric: He has a normal mood and affect. His behavior is normal.  Memory lapses.     Filed Vitals:   04/30/13 1551  BP: 126/58  Pulse: 60  Temp: 97.3 F (36.3 C)  TempSrc: Tympanic  Resp: 18      Labs reviewed: Basic Metabolic Panel:  Recent Labs  03/24/13 1619 03/24/13 2250  03/26/13 0450 03/27/13 0555 03/28/13 0422 04/11/13  NA 148*  --   < > 142 137 140 136*  K 3.8  --   < > 3.5* 3.5* 3.6* 4.8  CL 115*  --   < > 109 106 107  --   CO2 16*  --   < > 21 21 24   --   GLUCOSE 105*  --   < > 98 95 92  --   BUN 43*  --   < > 28* 28* 28* 22*  CREATININE 1.38*  --   < > 1.27 1.22 1.23 1.4*  CALCIUM 9.1  --   < > 8.5 8.6 8.4  --   TSH  --  2.456  --   --   --   --   --   < > = values in this interval not displayed. Liver Function Tests:  Recent Labs  03/24/13 1619 03/25/13 0545  AST 106* 83*  ALT 36 32  ALKPHOS 105 85  BILITOT 0.3 0.3  PROT 7.8 6.7  ALBUMIN 3.7 3.1*    Recent Labs  03/24/13 1619  LIPASE 14   CBC:  Recent Labs  03/24/13 1619  03/25/13 0545 03/26/13 0658 03/27/13 0555 03/28/13 0422 04/11/13  WBC 10.2 9.3 7.3 5.2 4.3 2.5  NEUTROABS 6.9 6.3  --   --   --   --   HGB 12.7* 11.0* 10.6* 10.2* 10.2* 10.2*  HCT 37.9* 32.9* 31.2* 30.4* 30.5* 31*  MCV 96.4 96.8 96.0 96.2 95.9  --   PLT 97* 85* 76* 77* 87* 162   Lipid Panel:  Recent Labs  03/25/13 0545  CHOL 122  HDL 41  LDLCALC 69  TRIG 58  CHOLHDL 3.0    Past Procedures:   03/24/2013 CLINICAL DATA: Altered mental status. Shortness of breath. EXAM: CHEST 2 VIEW IMPRESSION: No acute finding. Stable compared to prior exams.   03/24/2013 CLINICAL DATA: Fall, head trauma EXAM: CT HEAD WITHOUT CONTRAST IMPRESSION: 1. No intracranial trauma. 2. A chronic low-density extra-axial fluid collection anterior to the right frontal lobe likely represents hygroma. 3. Atrophy and microvascular disease. 4. No skull fracture.   03/26/2013 CLINICAL DATA: Fall. Found down. Evaluate for stroke. EXAM: MRI HEAD WITHOUT CONTRAST IMPRESSION: 1. Possible punctate acute or subacute left parietal infarct. 2. Mildly prominent CSF spaces over the right greater than left frontal lobes could reflect small subdural hygromas. 3. Moderate chronic small vessel ischemic disease.   03/24/2013 CLINICAL DATA: Diarrhea and abdominal pain. EXAM: CT ABDOMEN AND PELVIS WITH CONTRAST IMPRESSION: 1. No definite acute findings to account for the patient's symptoms. 2. However, there is a new 3.7 x 2.5 x 3.8 cm mass extending off the posterior aspect of the duodenum, which has imaging characteristics suspicious for a solid neoplasm such as a small GI stromal tumor (GIST). Correlation with EGD and biopsy is recommended if clinically appropriate. 3. Extensive colonic diverticulosis without findings to suggest acute diverticulitis at this time. 4. Severe atherosclerosis. 5. 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 6. Severe  calcifications of the  aortic valve. Echocardiographic correlation for evidence of underlying valvular dysfunction may be warranted if clinically appropriate. 7. 6 mm calculus lying dependently in the urinary bladder. 8. Additional incidental findings  03/25/2013 CLINICAL DATA: Left hip pain EXAM: PORTABLE PELVIS 1-2 VIEWS IMPRESSION: No evidence of hip fracture.   Assessment/Plan BPH (benign prostatic hyperplasia) No urinary retention, takes Flomax.   NSTEMI (non-ST elevated myocardial infarction) No angina since last visit.  Thrombocytopenia Resolved, plt 162 04/11/13  Anemia New baseline Hgb is 10s. Hgb 10.2 04/11/13   Abdominal mass No abd symptoms.   Renal failure Last Bun/creat [id number]  Metabolic acidosis Na [id number], Bun/creat 22/1.43, Co2 30 04/11/13  Acute CVA (cerebrovascular accident) Continue ASA and Plavix.    Family/ Staff Communication: observe the patient.   Goals of Care: AL  Labs/tests ordered: none

## 2013-05-13 ENCOUNTER — Telehealth: Payer: Self-pay | Admitting: *Deleted

## 2013-05-13 NOTE — Telephone Encounter (Signed)
Spoke with nurse Sherlynn Stalls at [facility]. [phone] ext. [phone]) and gave appointment with Dr. Benay Spice for 05/16/13.  Contact names, numbers, and directions were provided.

## 2013-05-16 ENCOUNTER — Encounter: Payer: Self-pay | Admitting: Oncology

## 2013-05-16 ENCOUNTER — Encounter (INDEPENDENT_AMBULATORY_CARE_PROVIDER_SITE_OTHER): Payer: Self-pay

## 2013-05-16 ENCOUNTER — Telehealth: Payer: Self-pay | Admitting: Oncology

## 2013-05-16 ENCOUNTER — Telehealth: Payer: Self-pay | Admitting: Gastroenterology

## 2013-05-16 ENCOUNTER — Ambulatory Visit: Payer: Medicare Other

## 2013-05-16 ENCOUNTER — Ambulatory Visit (HOSPITAL_BASED_OUTPATIENT_CLINIC_OR_DEPARTMENT_OTHER): Payer: Medicare Other | Admitting: Oncology

## 2013-05-16 ENCOUNTER — Ambulatory Visit (HOSPITAL_BASED_OUTPATIENT_CLINIC_OR_DEPARTMENT_OTHER): Payer: Medicare Other

## 2013-05-16 VITALS — BP 140/39 | HR 57 | Temp 97.3°F | Resp 18 | Ht 71.5 in | Wt 180.3 lb

## 2013-05-16 DIAGNOSIS — K3189 Other diseases of stomach and duodenum: Secondary | ICD-10-CM

## 2013-05-16 DIAGNOSIS — K319 Disease of stomach and duodenum, unspecified: Secondary | ICD-10-CM

## 2013-05-16 DIAGNOSIS — D61818 Other pancytopenia: Secondary | ICD-10-CM

## 2013-05-16 LAB — CBC WITH DIFFERENTIAL/PLATELET
BASO%: 0.5 % (ref 0.0–2.0)
BASOS ABS: 0 10*3/uL (ref 0.0–0.1)
EOS%: 3.4 % (ref 0.0–7.0)
Eosinophils Absolute: 0.1 10*3/uL (ref 0.0–0.5)
HEMATOCRIT: 34 % — AB (ref 38.4–49.9)
HEMOGLOBIN: 10.8 g/dL — AB (ref 13.0–17.1)
LYMPH#: 0.8 10*3/uL — AB (ref 0.9–3.3)
LYMPH%: 27.4 % (ref 14.0–49.0)
MCH: 32 pg (ref 27.2–33.4)
MCHC: 31.7 g/dL — ABNORMAL LOW (ref 32.0–36.0)
MCV: 100.9 fL — AB (ref 79.3–98.0)
MONO#: 0.5 10*3/uL (ref 0.1–0.9)
MONO%: 16.4 % — AB (ref 0.0–14.0)
NEUT%: 52.3 % (ref 39.0–75.0)
NEUTROS ABS: 1.5 10*3/uL (ref 1.5–6.5)
Platelets: 104 10*3/uL — ABNORMAL LOW (ref 140–400)
RBC: 3.37 10*6/uL — ABNORMAL LOW (ref 4.20–5.82)
RDW: 15.4 % — ABNORMAL HIGH (ref 11.0–14.6)
WBC: 2.9 10*3/uL — ABNORMAL LOW (ref 4.0–10.3)

## 2013-05-16 LAB — COMPREHENSIVE METABOLIC PANEL (CC13)
ALBUMIN: 3.7 g/dL (ref 3.5–5.0)
ALT: <6 U/L (ref 0–55)
AST: 12 U/L (ref 5–34)
Alkaline Phosphatase: 99 U/L (ref 40–150)
Anion Gap: 6 mEq/L (ref 3–11)
BUN: 20.8 mg/dL (ref 7.0–26.0)
CALCIUM: 9.1 mg/dL (ref 8.4–10.4)
CO2: 27 mEq/L (ref 22–29)
Chloride: 110 mEq/L — ABNORMAL HIGH (ref 98–109)
Creatinine: 1.3 mg/dL (ref 0.7–1.3)
Glucose: 98 mg/dl (ref 70–140)
POTASSIUM: 4.7 meq/L (ref 3.5–5.1)
Sodium: 144 mEq/L (ref 136–145)
Total Bilirubin: 0.25 mg/dL (ref 0.20–1.20)
Total Protein: 7.4 g/dL (ref 6.4–8.3)

## 2013-05-16 LAB — LACTATE DEHYDROGENASE (CC13): LDH: 138 U/L (ref 125–245)

## 2013-05-16 NOTE — Telephone Encounter (Signed)
[facility] DTR APPT SCHEDULE FOR APRIL. S/W JESSE @ [facility] RE APPT W/DR JACOBS FOR EGD/BX. PER JESSE AFTER REVIEW WE WILL CALL PT W/APPT. JESSE AWARE THAT NUMBER IN EPIC IS FOR DTR DONNA FORD. DTR AWARE [facility] WILL CALL W/APPT.

## 2013-05-16 NOTE — Progress Notes (Signed)
Colin Hudson Patient Consult   Referring MD: Art Kazimierz Springborn 80 y.o.  12-26-1918    Reason for Referral: Duodenal mass   HPI: He reports a few weeks of intermittent diarrhea and vomiting prior to an episode of vomiting and syncope in February. He was now on the floor of his apartment a day later and admitted to [facility]. The troponin was elevated and cardiology was consult. He was placed on metoprolol, losartan and Plavix.  A CT of the abdomen and pelvis on 03/24/2013 revealed pleural plaques at the lung bases with severe calcifications of the aortic valve. The liver, pancreas, and adrenal glands appeared unremarkable. A well-circumscribed ovoid mass measured 3.7 x 2.5 x 3.8 cm and extended off the posterior aspect of the proximal small bowel at the junction of the second and third portions of the duodenum. The mass was suspicious for a solid neoplasm. This is new compared to a study from April of 2010. Numerous colonic diverticulae without inflammatory changes. No pathologic distention of small bowel. No lymphadenopathy. 6 mm bladder calculus.  He was seen by palliative care and a decision was made to not further evaluate the duodenal mass.  He reports a few episodes of diarrhea since discharge from the hospital with associated presyncope symptoms. However he has been constipated in general.  His daughter arrange for the appointment here to consider additional of valuation of the duodenal mass and discuss the differential diagnosis.  Past Medical History  Diagnosis Date  . Pancytopenia     caused by medications he is no longer taking  . Glaucoma   . COPD (chronic obstructive pulmonary disease)   . Mitral regurgitation   . Gout   . Colon polyps   . Cancer     skin  . Shingles   .    [facility] [facility] Onychomycosis     fungal  . Shoulder fracture     Past Surgical History  Procedure Laterality Date  . Tonsillectomy    . Adenoidectomy    . Eye surgery     laser-glaucoma  . Eye surgery      cataract    Medications: Reviewed  Allergies:  Allergies  Allergen Reactions  . Aspirin Swelling    Face and eyes    Family history: No family history of cancer Social History:   He is a retired [employer] social research officer. He quit smoking cigarettes 30 years ago. No alcohol use at present. He has never been a heavy drinker. No transfusion history. No risk factor for HIV or hepatitis. He currently lives in the assisted living unit at [facility].   ROS:   Positives include: Constipation, leg swelling, numbness of the left fourth and fifth fingers for the past month, and decreased visual acuity, episodes of "flushing" and feeling clammy prior to having nausea  A complete ROS was otherwise negative.  Physical Exam:  Blood pressure 140/39, pulse 57, temperature 97.3 F (36.3 C), temperature source Oral, resp. rate 18, height 5' 11.5" (1.816 m), weight 180 lb 4.8 oz (81.784 kg).  HEENT: Oropharynx without visible mass, neck without mass Lungs: End inspiratory rhonchi at the left posterior base, no respiratory distress Cardiac: Regular rate and rhythm, 2/6 systolic murmur Abdomen: No hepatosplenomegaly, nontender, no mass GU: Firm enlargement of the left testicle (he reports this is a chronic finding)  Vascular: Trace to 1+ pitting edema at the left greater than right lower leg and ankle Lymph nodes: No cervical, supra-clavicular, axillary, or inguinal nodes  Neurologic: Alert and oriented, the motor exam appears intact in the upper and lower extremities Skin: Multiple benign appearing moles Musculoskeletal: No spine tenderness   LAB:  CBC  Lab Results  Component Value Date   WBC 2.9* 05/16/2013   HGB 10.8* 05/16/2013   HCT 34.0* 05/16/2013   MCV 100.9* 05/16/2013   PLT 104* 05/16/2013   NEUTROABS 1.5 05/16/2013     Imaging:  I reviewed the CT abdomen/pelvis images from 03/24/2013 with Colin Hudson and his daughter   Assessment/Plan:   1. Duodenal  mass  2. Nausea/vomiting/diarrhea in February 2015  3.   pancytopenia  4.   COPD  5.   renal insufficiency   Disposition:   Colin Hudson was diagnosed with a duodenal mass when he presented with nausea/vomiting and a syncope event in February. I reviewed the CT images and discussed the differential diagnosis with Colin Hudson and his daughter. It is not clear whether the duodenal mass caused his symptoms. The differential diagnosis includes small bowel carcinoma, carcinoid tumor, GIST, lymphoma, metastatic carcinoma, and a nonmalignant process such as a lipoma.  He has mild pancytopenia. This is most likely secondary to myelodysplasia, but could potentially be related to the duodenal mass. We will consider evaluation of the pancytopenia based on results from a biopsy of the duodenal mass.  I will present his case at the GI tumor conference 05/22/2013 and we will make a GI referral to consider an upper endoscopy and biopsy.  Colin Hudson will return for an office visit in 2-3 weeks. He appears to be a candidate for resection of the mass if this is felt to be causing the intermittent nausea and vomiting. He does not have symptoms to suggest carcinoid syndrome. We were unable to obtain a chromogranin A level today secondary to the lack of Medicare payment for this procedure.  Colin Hudson 05/16/2013, 5:31 PM

## 2013-05-16 NOTE — Progress Notes (Signed)
Checked in new pt with no financial concerns. °

## 2013-05-17 NOTE — Telephone Encounter (Signed)
I have spoken to patient's daughter to advise that Dr Ardis Hughs will not be in the office until Tuesday. He will review patient's chart at that time and will make a decision on what type of EUS needs to be completed as well as what date he is able to accommodate the patient. Advised that I just wanted the family to be aware of where we were in the process of getting patient scheduled. Daughter verbalizes understanding.

## 2013-05-20 ENCOUNTER — Encounter (HOSPITAL_COMMUNITY): Payer: Self-pay | Admitting: *Deleted

## 2013-05-20 ENCOUNTER — Other Ambulatory Visit: Payer: Self-pay

## 2013-05-20 ENCOUNTER — Encounter (HOSPITAL_COMMUNITY): Payer: Self-pay | Admitting: Pharmacy Technician

## 2013-05-20 DIAGNOSIS — K3189 Other diseases of stomach and duodenum: Secondary | ICD-10-CM

## 2013-05-20 NOTE — Telephone Encounter (Signed)
This can probably be biopsied with upper eus.  He will need to come off plavix for 5 days prior.  Can you please set up upper eus, radial +/- linear, 60 min, ++ propofol, next Thursday (23rd) for duodenal mass.

## 2013-05-20 NOTE — Telephone Encounter (Signed)
Jodette and I spoke and we saw you saw this pt on 2/15 for consult for cardiac clearance.

## 2013-05-20 NOTE — Telephone Encounter (Signed)
To Dr Radford Pax to advise... Per Jodette we did a consult on this pt earlier this year.

## 2013-05-20 NOTE — Telephone Encounter (Signed)
Coag letter sent to Dr Acie Fredrickson EUS scheduled, pt instructed and medications reviewed.  Patient instructions mailed to home.  Patient to call with any questions or concerns.

## 2013-05-20 NOTE — Telephone Encounter (Signed)
I have not seen patient since initial consult in Feb 2015 and that was not for cardiac clearance - need to see patient back in office before I can give clearance

## 2013-05-21 ENCOUNTER — Telehealth: Payer: Self-pay

## 2013-05-21 NOTE — Telephone Encounter (Signed)
Please inform pt in order to be cleared he needs to be seen in office by Dr Radford Pax. He can call us to be scheduled at [phone]

## 2013-05-21 NOTE — Progress Notes (Signed)
[id number] Spoke with Patty of Dr. Vickey Huger is aware pt. Needs instructions on Plavix use, and will inform pt's daughter Dierdre Highman, who will inform [facility]

## 2013-05-21 NOTE — Telephone Encounter (Signed)
Left message on machine to call back regarding appt for clearance and change of EUS appt

## 2013-05-21 NOTE — Telephone Encounter (Signed)
Error

## 2013-05-22 ENCOUNTER — Telehealth: Payer: Self-pay | Admitting: Cardiology

## 2013-05-22 NOTE — Telephone Encounter (Signed)
New Message  Pt daughter called for a Cardiac clearance. Upper endoscopy scheduled for 05/30/2013. Made appt with PA Richardson Dopp for 05/23/2013 at 11:10 am. Please forward notes.

## 2013-05-22 NOTE — Telephone Encounter (Signed)
I spoke with the pt's daughter and she is calling Dr Radford Pax to get an appt this week so that the pt can keep the 05/30/13 appt.  Colin Hudson can we get him in that quick?  The pt's daughter is upset that we will have to reschedule the EUS if not done..  Thanks for your help

## 2013-05-22 NOTE — Telephone Encounter (Signed)
Pt was never seen in [facility] only seen as a consult in Hospital with Dr Radford Pax. TO Arbie Cookey to make aware.

## 2013-05-22 NOTE — Telephone Encounter (Signed)
Left message on machine to call back  

## 2013-05-23 ENCOUNTER — Encounter: Payer: Self-pay | Admitting: Physician Assistant

## 2013-05-23 ENCOUNTER — Encounter: Payer: Self-pay | Admitting: *Deleted

## 2013-05-23 ENCOUNTER — Ambulatory Visit (INDEPENDENT_AMBULATORY_CARE_PROVIDER_SITE_OTHER): Payer: Medicare Other | Admitting: Physician Assistant

## 2013-05-23 VITALS — BP 160/78 | HR 60 | Ht 72.0 in | Wt 181.0 lb

## 2013-05-23 DIAGNOSIS — K3189 Other diseases of stomach and duodenum: Secondary | ICD-10-CM

## 2013-05-23 DIAGNOSIS — K319 Disease of stomach and duodenum, unspecified: Secondary | ICD-10-CM

## 2013-05-23 DIAGNOSIS — I059 Rheumatic mitral valve disease, unspecified: Secondary | ICD-10-CM

## 2013-05-23 DIAGNOSIS — I340 Nonrheumatic mitral (valve) insufficiency: Secondary | ICD-10-CM

## 2013-05-23 DIAGNOSIS — Z01810 Encounter for preprocedural cardiovascular examination: Secondary | ICD-10-CM

## 2013-05-23 DIAGNOSIS — I5042 Chronic combined systolic (congestive) and diastolic (congestive) heart failure: Secondary | ICD-10-CM

## 2013-05-23 DIAGNOSIS — I6350 Cerebral infarction due to unspecified occlusion or stenosis of unspecified cerebral artery: Secondary | ICD-10-CM

## 2013-05-23 DIAGNOSIS — R609 Edema, unspecified: Secondary | ICD-10-CM

## 2013-05-23 DIAGNOSIS — I428 Other cardiomyopathies: Secondary | ICD-10-CM

## 2013-05-23 DIAGNOSIS — I429 Cardiomyopathy, unspecified: Secondary | ICD-10-CM

## 2013-05-23 MED ORDER — METOPROLOL TARTRATE 25 MG PO TABS: 12.5000 mg | ORAL_TABLET | Freq: Two times a day (BID) | ORAL | Status: DC

## 2013-05-23 MED ORDER — FUROSEMIDE 20 MG PO TABS: ORAL_TABLET | ORAL | Status: AC

## 2013-05-23 NOTE — Progress Notes (Signed)
[place], [place],   [address] Phone: [phone] Fax:  [phone]  Date:  05/23/2013   ID:  Estelle Skibicki, DOB 09/09/1918, MRN [id number]  PCP:  [name], MD  Cardiologist:  Dr. Fransico Him     History of Present Illness: Colin Hudson is a 80 y.o. male with a hx of COPD, mitral regurgitation who lives at [facility].  He was admitted 2/15-2/19.  He was found down on the floor in his independent living facility. He had had diarrhea, nausea and vomiting for several days. Cardiac markers were obtained. These were elevated consistent with non-STEMI. Abdominal CT demonstrated duodenal mass. He was seen by cardiology. Echocardiogram demonstrated mildly depressed LV function with an EF of 45-50%, moderate aortic insufficiency and mild to moderate mitral regurgitation.  CPK was also elevated and this was thought to possibly represent rhabdomyolysis.  Troponins were elevated in a fairly flat pattern. He had no wall motion abnormalities on echocardiogram.  It was felt that this was likely a type II non-STEMI. He was also noted to have a possible punctate acute or subacute left parietal infarct on MRI. Conservative management was recommended given his advanced age and co-morbidities. He was seen by palliative care.   Since discharge, he has seen oncology for further evaluation of his duodenal mass. He has been referred to gastroenterology for possible EGD and bx.  He is seen back for cardiac clearance.  He needs to come off of Plavix. Of note, he has an allergy to aspirin. He walks a walker. He is staying in the assisted living facility. He probably describes NYHA class IIb symptoms. He denies orthopnea PND. He has noted recent ankle edema. He denies increased abdominal girth. He denies chest pain, syncope or dizziness.  Studies:  - Echo (03/25/13):  Mild LVH, EF 45-50%, no RWMA, Gr 1 DD, mod AI, mild to mod MR, mild LAE, mild RVE, mild to mod reduced RVSF, trivial  effusion   Recent Labs: 03/24/2013: TSH 2.456  03/25/2013: HDL Cholesterol 41; LDL (calc) 69  05/16/2013: ALT <6; Creatinine 1.3; Hemoglobin 10.8*; Potassium 4.7   Wt Readings from Last 3 Encounters:  05/23/13 181 lb (82.101 kg)  05/16/13 180 lb 4.8 oz (81.784 kg)  03/27/13 174 lb 11.2 oz (79.243 kg)     Past Medical History  Diagnosis Date  . Pancytopenia     caused by medications he is no longer taking  . Glaucoma   . COPD (chronic obstructive pulmonary disease)   . Mitral regurgitation   . Gout   . Colon polyps   . Cancer     skin  . Shingles   . Syncope   . Onychomycosis     fungal  . Shoulder fracture   . Heart murmur   . Decreased ambulation status     ambulates with walker  . Constipation     Current Outpatient Prescriptions  Medication Sig Dispense Refill  . clopidogrel (PLAVIX) 75 MG tablet Take 75 mg by mouth daily with breakfast.      . losartan (COZAAR) 25 MG tablet Take 25 mg by mouth every morning.      . metoprolol tartrate (LOPRESSOR) 25 MG tablet Take 25 mg by mouth 2 (two) times daily.      . pilocarpine (PILOCAR) 4 % ophthalmic solution Place 1 drop into both eyes 4 (four) times daily.      Vladimir Faster Glycol-Propyl Glycol (SYSTANE) 0.4-0.3 % SOLN Apply 1 drop to eye  daily as needed (for dry eyes).      Marland Kitchen senna (SENOKOT) 8.6 MG tablet Take 1 tablet by mouth 2 (two) times daily.       . tamsulosin (FLOMAX) 0.4 MG CAPS capsule Take 0.4 mg by mouth daily.      . Travoprost (TRAVATAN Z OP) Place 1 drop into both eyes at bedtime.        No current facility-administered medications for this visit.    Allergies:   Aspirin   Social History:  The patient  reports that he has quit smoking. He does not have any smokeless tobacco history on file. He reports that he does not drink alcohol or use illicit drugs.   Family History:  The patient's family history includes CAD in his brother.   ROS:  Please see the history of present illness.      All other  systems reviewed and negative.   PHYSICAL EXAM: VS:  BP 160/78  Pulse 60  Ht 6' (1.829 m)  Wt 181 lb (82.101 kg)  BMI 24.54 kg/m2 Well nourished, well developed, in no acute distress HEENT: normal Neck: no JVD Cardiac:  normal S1, S2; RRR; 2/6 holosystolic murmurat the LLSB Lungs:  Decreased breath sounds bilaterally, no wheezing, rhonchi or rales Abd: soft, nontender, no hepatomegaly Ext: 1 + bilateral ankle edema Skin: warm and dry Neuro:  CNs 2-12 intact, no focal abnormalities noted  EKG:  Sinus bradycardia, HR 49, leftward axis     ASSESSMENT AND PLAN:  1. Surgical Clearance: The patient is on Plavix for secondary prevention. He is not having any unstable cardiac conditions at this time. I discussed this case with Dr. Lovena Le (DOD). It is acceptable for him to come off of Plavix for his procedure. He may resume this postoperatively when felt to be safe. He does not require any further cardiac workup at this time. 2. History of non-STEMI: This was likely a type II non-STEMI in the setting of acute illness. Medical therapy is continued. 3. Cardiomyopathy: He had no wall motion abnormalities on echocardiogram. This is probably a nonischemic cardiomyopathy. Continue current therapy with ARB. He is somewhat bradycardic. Given his advanced age, I will decrease his metoprolol tartrate to 12.5 mg twice a day. 4. Chronic Combined Systolic and Diastolic CHF: He has some volume excess on exam. Given his advanced age, I will gently diurese him with Lasix 10 mg on Monday, Wednesday, Friday only. Repeat basic metabolic panel in one week. 5. Mitral regurgitation: Mild to moderate a recent echocardiogram. 6. Duodenal Mass: Follow up with GI and oncology as planned. 7. Disposition: Follow up with Dr. Radford Pax in 3 months.  Signed, Richardson Dopp, PA-C  05/23/2013 11:23 AM

## 2013-05-23 NOTE — Patient Instructions (Addendum)
Your physician recommends that you return for lab work in: 1 week for a BMP.  You  Do not have to be fasting   Medication changes:  Take lasix (furosemide) 10 mg on  Monday, Wednesday, and Friday only                                      Decrease Metoprolol to 12.5mg   Twice daily  Your physician recommends that you schedule a follow-up appointment in: 3 months with Dr. Mallie Snooks may hold your plavix for your upcoming procedure. Resume your plavix when directed to do so by GI

## 2013-05-24 ENCOUNTER — Telehealth: Payer: Self-pay

## 2013-05-24 NOTE — Telephone Encounter (Signed)
Left message on machine to call back  

## 2013-05-24 NOTE — Progress Notes (Signed)
Colin Hudson, He is safe to hold plavix for next weeks EUS (5 days). Can you please call him for reminder about the procedure and plavix.

## 2013-05-24 NOTE — Telephone Encounter (Signed)
Message copied by Barron Alvine on Fri May 24, 2013  8:18 AM ------      Message from: Milus Banister      Created: Fri May 24, 2013  7:18 AM                   ----- Message -----         From: Liliane Shi, PA-C         Sent: 05/23/2013   5:38 PM           To: Milus Banister, MD             ------

## 2013-05-24 NOTE — Telephone Encounter (Signed)
Pt has been notified.

## 2013-05-24 NOTE — Telephone Encounter (Signed)
Colin Hudson, He is safe to hold plavix for next weeks EUS (5 days). Can you please call him for reminder about the procedure and plavix. 

## 2013-05-30 ENCOUNTER — Encounter (HOSPITAL_COMMUNITY)
Admission: RE | Disposition: A | Discharge status: Home / Self Care | Payer: Self-pay | Source: Ambulatory Visit | Attending: Gastroenterology

## 2013-05-30 ENCOUNTER — Ambulatory Visit (HOSPITAL_COMMUNITY): Payer: Medicare Other | Admitting: Anesthesiology

## 2013-05-30 ENCOUNTER — Encounter (HOSPITAL_COMMUNITY): Payer: Medicare Other | Admitting: Anesthesiology

## 2013-05-30 ENCOUNTER — Ambulatory Visit (HOSPITAL_COMMUNITY)
Admission: RE | Admit: 2013-05-30 | Discharge: 2013-05-30 | Disposition: A | Discharge status: Home / Self Care | Payer: Medicare Other | Source: Ambulatory Visit | Attending: Gastroenterology | Admitting: Gastroenterology

## 2013-05-30 ENCOUNTER — Encounter (HOSPITAL_COMMUNITY): Payer: Self-pay | Admitting: *Deleted

## 2013-05-30 DIAGNOSIS — J449 Chronic obstructive pulmonary disease, unspecified: Secondary | ICD-10-CM | POA: Diagnosis not present

## 2013-05-30 DIAGNOSIS — N289 Disorder of kidney and ureter, unspecified: Secondary | ICD-10-CM | POA: Diagnosis not present

## 2013-05-30 DIAGNOSIS — D61818 Other pancytopenia: Secondary | ICD-10-CM | POA: Insufficient documentation

## 2013-05-30 DIAGNOSIS — I059 Rheumatic mitral valve disease, unspecified: Secondary | ICD-10-CM | POA: Insufficient documentation

## 2013-05-30 DIAGNOSIS — K3189 Other diseases of stomach and duodenum: Secondary | ICD-10-CM

## 2013-05-30 DIAGNOSIS — D649 Anemia, unspecified: Secondary | ICD-10-CM | POA: Insufficient documentation

## 2013-05-30 DIAGNOSIS — K319 Disease of stomach and duodenum, unspecified: Secondary | ICD-10-CM

## 2013-05-30 DIAGNOSIS — K5900 Constipation, unspecified: Secondary | ICD-10-CM | POA: Diagnosis not present

## 2013-05-30 DIAGNOSIS — R112 Nausea with vomiting, unspecified: Secondary | ICD-10-CM | POA: Diagnosis not present

## 2013-05-30 DIAGNOSIS — M109 Gout, unspecified: Secondary | ICD-10-CM | POA: Diagnosis not present

## 2013-05-30 DIAGNOSIS — Z87891 Personal history of nicotine dependence: Secondary | ICD-10-CM | POA: Insufficient documentation

## 2013-05-30 DIAGNOSIS — J4489 Other specified chronic obstructive pulmonary disease: Secondary | ICD-10-CM | POA: Insufficient documentation

## 2013-05-30 DIAGNOSIS — I252 Old myocardial infarction: Secondary | ICD-10-CM | POA: Insufficient documentation

## 2013-05-30 DIAGNOSIS — D696 Thrombocytopenia, unspecified: Secondary | ICD-10-CM | POA: Insufficient documentation

## 2013-05-30 DIAGNOSIS — R1909 Other intra-abdominal and pelvic swelling, mass and lump: Secondary | ICD-10-CM | POA: Diagnosis present

## 2013-05-30 DIAGNOSIS — H409 Unspecified glaucoma: Secondary | ICD-10-CM | POA: Diagnosis not present

## 2013-05-30 DIAGNOSIS — R197 Diarrhea, unspecified: Secondary | ICD-10-CM | POA: Insufficient documentation

## 2013-05-30 HISTORY — DX: Cardiac murmur, unspecified: R01.1

## 2013-05-30 HISTORY — PX: EUS: SHX5427

## 2013-05-30 HISTORY — DX: Other reduced mobility: Z74.09

## 2013-05-30 HISTORY — DX: Constipation, unspecified: K59.00

## 2013-05-30 SURGERY — UPPER ENDOSCOPIC ULTRASOUND (EUS) LINEAR
Anesthesia: Monitor Anesthesia Care

## 2013-05-30 MED ORDER — PROPOFOL 10 MG/ML IV BOLUS
INTRAVENOUS | Status: AC
  Filled 2013-05-30: qty 20

## 2013-05-30 MED ORDER — SODIUM CHLORIDE 0.9 % IV SOLN: INTRAVENOUS | Status: DC

## 2013-05-30 MED ORDER — PROPOFOL 10 MG/ML IV BOLUS
INTRAVENOUS | Status: DC | PRN
  Administered 2013-05-30: 40 mg via INTRAVENOUS
  Administered 2013-05-30: 20 mg via INTRAVENOUS

## 2013-05-30 MED ORDER — PROPOFOL INFUSION 10 MG/ML OPTIME
INTRAVENOUS | Status: DC | PRN
  Administered 2013-05-30: 100 ug/kg/min via INTRAVENOUS

## 2013-05-30 MED ORDER — LACTATED RINGERS IV SOLN
INTRAVENOUS | Status: DC
  Administered 2013-05-30: 1000 mL via INTRAVENOUS

## 2013-05-30 MED ORDER — BUTAMBEN-TETRACAINE-BENZOCAINE 2-2-14 % EX AERO
INHALATION_SPRAY | CUTANEOUS | Status: DC | PRN
  Administered 2013-05-30: 2 via TOPICAL

## 2013-05-30 NOTE — Op Note (Signed)
[facility] [place], [address]   ENDOSCOPIC ULTRASOUND PROCEDURE REPORT  PATIENT: Colin Hudson, Colin Hudson  MR#: [id number] BIRTHDATE: December 09, 1918  GENDER: Male ENDOSCOPIST: Milus Banister, MD REFERRED BY:  Arturo Morton, M.D. PROCEDURE DATE:  05/30/2013 PROCEDURE:   Upper EUS w/FNA ASA CLASS:      Class II INDICATIONS:   duodenal or paraduodenal mass noted on recent CT scan when admitted with nausea/vomiting; unclear if mass caused his symptoms. MEDICATIONS: MAC sedation, administered by CRNA  DESCRIPTION OF PROCEDURE:   After the risks benefits and alternatives of the procedure were  explained, informed consent was obtained. The patient was then placed in the left, lateral, decubitus postion and IV sedation was administered. Throughout the procedure, the patients blood pressure, pulse and oxygen saturations were monitored continuously.  Under direct visualization, the Pentax Radial EUS [id number]  endoscope was introduced through the mouth  and advanced to the second portion of the duodenum .  Water was used as necessary to provide an acoustic interface.  Upon completion of the imaging, water was removed and the patient was sent to the recovery room in satisfactory condition.  Endoscopic findings: 1. Normal UGI tract  EUS findings: 1. Round, hypoechoic, well circumscribed mass that directly abuts the wall of the second portion of the duodenum and MAY communicate with the duodenal wall muscularis propria layer. The mass measures 3.7cm maximally, lays within 1-2cm of the major papilla.  The mass was sampled with four transduodenal passes of a 22 gauge EUS FNA needle. 2. No periportal, peripancreatic adenopathy. 3. Pancreatic parenchyma was normal throughout. 4. Main pancreatic duct was normal 5. CBD was normal, non-dilated 6. Limited views of liver, spleen, portal and splenic vessels were all normal  Impression: 3.7cm well circumscribed mass  that directly abuts the second duodenum and may communicate with the muscularis propria layer of the duodenal wall. The mass lays wihin 1-2cm of the major papilla. Preliminary cytology review shows spindle shaped cells as well as pleomorphism. Await final reading.    _______________________________ eSigned:  Milus Banister, MD 05/30/2013 10:56 AM   PATIENT NAME:  Colin Hudson, Colin Hudson MR#: [id number]

## 2013-05-30 NOTE — Discharge Instructions (Signed)
You can restart your plavix tomorrow.  YOU HAD AN ENDOSCOPIC PROCEDURE TODAY: Refer to the procedure report that was given to you for any specific questions about what was found during the examination.  If the procedure report does not answer your questions, please call your gastroenterologist to clarify.  YOU SHOULD EXPECT: Some feelings of bloating in the abdomen. Passage of more gas than usual.  Walking can help get rid of the air that was put into your GI tract during the procedure and reduce the bloating. If you had a lower endoscopy (such as a colonoscopy or flexible sigmoidoscopy) you may notice spotting of blood in your stool or on the toilet paper.   DIET: Your first meal following the procedure should be a light meal and then it is ok to progress to your normal diet.  A half-sandwich or bowl of soup is an example of a good first meal.  Heavy or fried foods are harder to digest and may make you feel nasueas or bloated.  Drink plenty of fluids but you should avoid alcoholic beverages for 24 hours.  ACTIVITY: Your care partner should take you home directly after the procedure.  You should plan to take it easy, moving slowly for the rest of the day.  You can resume normal activity the day after the procedure however you should NOT DRIVE or use heavy machinery for 24 hours (because of the sedation medicines used during the test).    SYMPTOMS TO REPORT IMMEDIATELY  A gastroenterologist can be reached at any hour.  Please call your doctor's office for any of the following symptoms:   Following lower endoscopy (colonoscopy, flexible sigmoidoscopy)  Excessive amounts of blood in the stool  Significant tenderness, worsening of abdominal pains  Swelling of the abdomen that is new, acute  Fever of 100 or higher  Following upper endoscopy (EGD, EUS, ERCP)  Vomiting of blood or coffee ground material  New, significant abdominal pain  New, significant chest pain or pain under the shoulder  blades  Painful or persistently difficult swallowing  New shortness of breath  Black, tarry-looking stools  FOLLOW UP: If any biopsies were taken you will be contacted by phone or by letter within the next 1-3 weeks.  Call your gastroenterologist if you have not heard about the biopsies in 3 weeks.  Please also call your gastroenterologist's office with any specific questions about appointments or follow up tests.

## 2013-05-30 NOTE — Anesthesia Preprocedure Evaluation (Addendum)
Anesthesia Evaluation  Patient identified by MRN, date of birth, ID band Patient awake    Reviewed: Allergy & Precautions, H&P , NPO status , Patient's Chart, lab work & pertinent test results  Airway Mallampati: II TM Distance: >3 FB Neck ROM: Full    Dental   Pulmonary COPDformer smoker,  breath sounds clear to auscultation  Pulmonary exam normal       Cardiovascular Exercise Tolerance: Good + Past MI + Valvular Problems/Murmurs MR Rhythm:Regular Rate:Normal + Systolic murmurs Walks with walker  3/6 systolic murmur.   Neuro/Psych Walks with walker since February, otherwise no lateral weakness. CVA negative psych ROS   GI/Hepatic negative GI ROS, Neg liver ROS,   Endo/Other  negative endocrine ROS  Renal/GU Renal InsufficiencyRenal disease  negative genitourinary   Musculoskeletal negative musculoskeletal ROS (+)   Abdominal   Peds negative pediatric ROS (+)  Hematology  (+) anemia , Thrombocytopenia.   Anesthesia Other Findings   Reproductive/Obstetrics negative OB ROS                         Anesthesia Physical Anesthesia Plan  ASA: III  Anesthesia Plan: MAC   Post-op Pain Management:    Induction: Intravenous  Airway Management Planned:   Additional Equipment:   Intra-op Plan:   Post-operative Plan:   Informed Consent: I have reviewed the patients History and Physical, chart, labs and discussed the procedure including the risks, benefits and alternatives for the proposed anesthesia with the patient or authorized representative who has indicated his/her understanding and acceptance.   Dental advisory given  Plan Discussed with: CRNA  Anesthesia Plan Comments:         Anesthesia Quick Evaluation

## 2013-05-30 NOTE — H&P (View-Only) (Signed)
Colin Hudson   Referring MD: Art Kazimierz Springborn 80 y.o.  12-26-1918    Reason for Referral: Duodenal mass   HPI: He reports a few weeks of intermittent diarrhea and vomiting prior to an episode of vomiting and syncope in February. He was now on the floor of his apartment a day later and admitted to [facility]. The troponin was elevated and cardiology was Hudson. He was placed on metoprolol, losartan and Plavix.  A CT of the abdomen and pelvis on 03/24/2013 revealed pleural plaques at the lung bases with severe calcifications of the aortic valve. The liver, pancreas, and adrenal glands appeared unremarkable. A well-circumscribed ovoid mass measured 3.7 x 2.5 x 3.8 cm and extended off the posterior aspect of the proximal small bowel at the junction of the second and third portions of the duodenum. The mass was suspicious for a solid neoplasm. This is new compared to a study from April of 2010. Numerous colonic diverticulae without inflammatory changes. No pathologic distention of small bowel. No lymphadenopathy. 6 mm bladder calculus.  He was seen by palliative care and a decision was made to not further evaluate the duodenal mass.  He reports a few episodes of diarrhea since discharge from the hospital with associated presyncope symptoms. However he has been constipated in general.  His daughter arrange for the appointment here to consider additional of valuation of the duodenal mass and discuss the differential diagnosis.  Past Medical History  Diagnosis Date  . Pancytopenia     caused by medications he is no longer taking  . Glaucoma   . COPD (chronic obstructive pulmonary disease)   . Mitral regurgitation   . Gout   . Colon polyps   . Cancer     skin  . Shingles   .    Marland Kitchen Onychomycosis     fungal  . Shoulder fracture     Past Surgical History  Procedure Laterality Date  . Tonsillectomy    . Adenoidectomy    . Eye surgery     laser-glaucoma  . Eye surgery      cataract    Medications: Reviewed  Allergies:  Allergies  Allergen Reactions  . Aspirin Swelling    Face and eyes    Family history: No family history of cancer Social History:   He is a retired [employer] social research officer. He quit smoking cigarettes 30 years ago. No alcohol use at present. He has never been a heavy drinker. No transfusion history. No risk factor for HIV or hepatitis. He currently lives in the assisted living unit at [facility].   ROS:   Positives include: Constipation, leg swelling, numbness of the left fourth and fifth fingers for the past month, and decreased visual acuity, episodes of "flushing" and feeling clammy prior to having nausea  A complete ROS was otherwise negative.  Physical Exam:  Blood pressure 140/39, pulse 57, temperature 97.3 F (36.3 C), temperature source Oral, resp. rate 18, height 5' 11.5" (1.816 m), weight 180 lb 4.8 oz (81.784 kg).  HEENT: Oropharynx without visible mass, neck without mass Lungs: End inspiratory rhonchi at the left posterior base, no respiratory distress Cardiac: Regular rate and rhythm, 2/6 systolic murmur Abdomen: No hepatosplenomegaly, nontender, no mass GU: Firm enlargement of the left testicle (he reports this is a chronic finding)  Vascular: Trace to 1+ pitting edema at the left greater than right lower leg and ankle Lymph nodes: No cervical, supra-clavicular, axillary, or inguinal nodes  Neurologic: Alert and oriented, the motor exam appears intact in the upper and lower extremities Skin: Multiple benign appearing moles Musculoskeletal: No spine tenderness   LAB:  CBC  Lab Results  Component Value Date   WBC 2.9* 05/16/2013   HGB 10.8* 05/16/2013   HCT 34.0* 05/16/2013   MCV 100.9* 05/16/2013   PLT 104* 05/16/2013   NEUTROABS 1.5 05/16/2013     Imaging:  I reviewed the CT abdomen/pelvis images from 03/24/2013 with Colin Hudson and his daughter   Assessment/Plan:   1. Duodenal  mass  2. Nausea/vomiting/diarrhea in February 2015  3.   pancytopenia  4.   COPD  5.   renal insufficiency   Disposition:   Colin Hudson was diagnosed with a duodenal mass when he presented with nausea/vomiting and a syncope event in February. I reviewed the CT images and discussed the differential diagnosis with Colin Hudson and his daughter. It is not clear whether the duodenal mass caused his symptoms. The differential diagnosis includes small bowel carcinoma, carcinoid tumor, GIST, lymphoma, metastatic carcinoma, and a nonmalignant process such as a lipoma.  He has mild pancytopenia. This is most likely secondary to myelodysplasia, but could potentially be related to the duodenal mass. We will consider evaluation of the pancytopenia based on results from a biopsy of the duodenal mass.  I will present his case at the GI tumor conference 05/22/2013 and we will make a GI referral to consider an upper endoscopy and biopsy.  Colin Hudson will return for an office visit in 2-3 weeks. He appears to be a candidate for resection of the mass if this is felt to be causing the intermittent nausea and vomiting. He does not have symptoms to suggest carcinoid syndrome. We were unable to obtain a chromogranin A level today secondary to the lack of Medicare payment for this procedure.  Ladell Pier 05/16/2013, 5:31 PM

## 2013-05-30 NOTE — Transfer of Care (Signed)
Immediate Anesthesia Transfer of Care Note  Patient: Colin Hudson  Procedure(s) Performed: Procedure(s) (LRB): UPPER ENDOSCOPIC ULTRASOUND (EUS) LINEAR (N/A)  Patient Location: PACU  Anesthesia Type: MAC  Level of Consciousness: sedated, patient cooperative and responds to stimulation  Airway & Oxygen Therapy: Patient Spontanous Breathing and Patient connected to face mask oxgen  Post-op Assessment: Report given to PACU RN and Post -op Vital signs reviewed and stable  Post vital signs: Reviewed and stable  Complications: No apparent anesthesia complications

## 2013-05-30 NOTE — Interval H&P Note (Signed)
History and Physical Interval Note:  05/30/2013 9:56 AM  Colin Hudson  has presented today for surgery, with the diagnosis of Duodenal mass [537.89]  The various methods of treatment have been discussed with the patient and family. After consideration of risks, benefits and other options for treatment, the patient has consented to  Procedure(s): UPPER ENDOSCOPIC ULTRASOUND (EUS) LINEAR (N/A) as a surgical intervention .  The patient's history has been reviewed, patient examined, no change in status, stable for surgery.  I have reviewed the patient's chart and labs.  Questions were answered to the patient's satisfaction.     Milus Banister

## 2013-05-30 NOTE — Anesthesia Postprocedure Evaluation (Signed)
  Anesthesia Post-op Note  Patient: Colin Hudson  Procedure(s) Performed: Procedure(s) (LRB): UPPER ENDOSCOPIC ULTRASOUND (EUS) LINEAR (N/A)  Patient Location: PACU  Anesthesia Type: MAC  Level of Consciousness: awake and alert   Airway and Oxygen Therapy: Patient Spontanous Breathing  Post-op Pain: mild  Post-op Assessment: Post-op Vital signs reviewed, Patient's Cardiovascular Status Stable, Respiratory Function Stable, Patent Airway and No signs of Nausea or vomiting  Last Vitals:  Filed Vitals:   05/30/13 1140  BP: 120/55  Pulse:   Temp:   Resp: 20    Post-op Vital Signs: stable   Complications: No apparent anesthesia complications

## 2013-05-31 ENCOUNTER — Encounter (HOSPITAL_COMMUNITY): Payer: Self-pay | Admitting: Gastroenterology

## 2013-05-31 ENCOUNTER — Ambulatory Visit: Payer: Medicare Other | Admitting: Nurse Practitioner

## 2013-05-31 ENCOUNTER — Telehealth: Payer: Self-pay | Admitting: Oncology

## 2013-05-31 ENCOUNTER — Encounter: Payer: Self-pay | Admitting: Cardiology

## 2013-05-31 NOTE — Telephone Encounter (Signed)
[name] called and cancelled and r/s appt, she does not want pt seeing a [facility]level emailed MD, waitihng for an answer

## 2013-06-05 ENCOUNTER — Telehealth: Payer: Self-pay | Admitting: Oncology

## 2013-06-05 NOTE — Telephone Encounter (Signed)
talked to pt's daughter and gave her appt, she is not going to be in town this Friday, emailed MD agagin waiting for response

## 2013-06-07 ENCOUNTER — Ambulatory Visit: Payer: Medicare Other | Admitting: Nurse Practitioner

## 2013-06-20 ENCOUNTER — Telehealth: Payer: Self-pay | Admitting: *Deleted

## 2013-06-20 NOTE — Telephone Encounter (Signed)
Message from pt's daughter reporting he has not had a BM since 4/5. This has been an ongoing issue since his hospital admission. She reports pt is worried about this. Currently in assisted living. Colin Hudson reports she has requested night nurse to administer an enema. Per Colin Hudson, pt is not having pain, nausea or vomiting. Eating several meals daily.  Reviewed with Dr. Benay Spice: Contact PCP to evaluate constipation, they can contact oncology as needed. Colin Hudson voiced understanding.

## 2013-06-21 ENCOUNTER — Encounter: Payer: Self-pay | Admitting: *Deleted

## 2013-06-24 ENCOUNTER — Telehealth: Payer: Self-pay | Admitting: Oncology

## 2013-06-24 NOTE — Telephone Encounter (Signed)
Talked to daughter she is aware of the appt for 5/29

## 2013-07-03 ENCOUNTER — Telehealth: Payer: Self-pay | Admitting: Oncology

## 2013-07-03 NOTE — Telephone Encounter (Signed)
s.w. pt daughter and r/s appt....ok and aware of new date/time

## 2013-07-05 ENCOUNTER — Ambulatory Visit: Payer: Medicare Other | Admitting: Oncology

## 2013-08-12 ENCOUNTER — Ambulatory Visit (HOSPITAL_BASED_OUTPATIENT_CLINIC_OR_DEPARTMENT_OTHER): Payer: Medicare Other | Admitting: Oncology

## 2013-08-12 ENCOUNTER — Other Ambulatory Visit: Payer: Self-pay | Admitting: Oncology

## 2013-08-12 ENCOUNTER — Ambulatory Visit (HOSPITAL_BASED_OUTPATIENT_CLINIC_OR_DEPARTMENT_OTHER): Payer: Medicare Other

## 2013-08-12 ENCOUNTER — Telehealth: Payer: Self-pay | Admitting: Oncology

## 2013-08-12 VITALS — BP 174/54 | HR 62 | Temp 97.5°F | Resp 19 | Ht 72.0 in | Wt 184.0 lb

## 2013-08-12 DIAGNOSIS — R1900 Intra-abdominal and pelvic swelling, mass and lump, unspecified site: Secondary | ICD-10-CM

## 2013-08-12 DIAGNOSIS — D696 Thrombocytopenia, unspecified: Secondary | ICD-10-CM

## 2013-08-12 DIAGNOSIS — D61818 Other pancytopenia: Secondary | ICD-10-CM

## 2013-08-12 LAB — CBC WITH DIFFERENTIAL/PLATELET
BASO%: 0.3 % (ref 0.0–2.0)
Basophils Absolute: 0 10*3/uL (ref 0.0–0.1)
EOS%: 4.1 % (ref 0.0–7.0)
Eosinophils Absolute: 0.1 10*3/uL (ref 0.0–0.5)
HCT: 31.6 % — ABNORMAL LOW (ref 38.4–49.9)
HGB: 10.2 g/dL — ABNORMAL LOW (ref 13.0–17.1)
LYMPH#: 0.8 10*3/uL — AB (ref 0.9–3.3)
LYMPH%: 33.4 % (ref 14.0–49.0)
MCH: 32.5 pg (ref 27.2–33.4)
MCHC: 32.1 g/dL (ref 32.0–36.0)
MCV: 101.3 fL — ABNORMAL HIGH (ref 79.3–98.0)
MONO#: 0.5 10*3/uL (ref 0.1–0.9)
MONO%: 21.9 % — ABNORMAL HIGH (ref 0.0–14.0)
NEUT#: 0.9 10*3/uL — ABNORMAL LOW (ref 1.5–6.5)
NEUT%: 40.3 % (ref 39.0–75.0)
PLATELETS: 102 10*3/uL — AB (ref 140–400)
RBC: 3.12 10*6/uL — AB (ref 4.20–5.82)
RDW: 15.9 % — ABNORMAL HIGH (ref 11.0–14.6)
WBC: 2.3 10*3/uL — AB (ref 4.0–10.3)

## 2013-08-12 LAB — COMPREHENSIVE METABOLIC PANEL (CC13)
ALBUMIN: 3.8 g/dL (ref 3.5–5.0)
ALT: 18 U/L (ref 0–55)
ANION GAP: 6 meq/L (ref 3–11)
AST: 17 U/L (ref 5–34)
Alkaline Phosphatase: 94 U/L (ref 40–150)
BUN: 30.3 mg/dL — ABNORMAL HIGH (ref 7.0–26.0)
CALCIUM: 9.3 mg/dL (ref 8.4–10.4)
CHLORIDE: 109 meq/L (ref 98–109)
CO2: 27 meq/L (ref 22–29)
Creatinine: 1.5 mg/dL — ABNORMAL HIGH (ref 0.7–1.3)
Glucose: 90 mg/dl (ref 70–140)
Potassium: 4.3 mEq/L (ref 3.5–5.1)
SODIUM: 142 meq/L (ref 136–145)
TOTAL PROTEIN: 7.8 g/dL (ref 6.4–8.3)
Total Bilirubin: 0.39 mg/dL (ref 0.20–1.20)

## 2013-08-12 LAB — LACTATE DEHYDROGENASE (CC13): LDH: 113 U/L — ABNORMAL LOW (ref 125–245)

## 2013-08-12 LAB — CHCC SMEAR

## 2013-08-12 LAB — TECHNOLOGIST REVIEW

## 2013-08-12 NOTE — Progress Notes (Signed)
  [facility] OFFICE PROGRESS NOTE   Diagnosis: Abdominal mass  INTERVAL HISTORY:   He returns for followup of the abdominal mass. He underwent an endoscopic ultrasound-guided biopsy 05/30/2013. This confirmed a round hypoechoic circumscribe mass abutting the second portion of the duodenum. A biopsy was nondiagnostic. The pancreas appeared normal. No lymphadenopathy.  He denies recurrent diarrhea, nausea, and abdominal pain. He reports constipation that is relieved with milk of magnesia. No fever, night sweats, or anorexia  He reports a rash at the lower arm bilaterally the past one month. No sun exposure. No pruritus. He has left greater than right leg edema.  Objective:  Vital signs in last 24 hours:  Blood pressure 174/54, pulse 62, temperature 97.5 F (36.4 C), temperature source Oral, resp. rate 19, height 6' (1.829 m), weight 184 lb (83.462 kg).    HEENT: Neck without mass Lymphatics: No cervical, supra-clavicular, axillary, or inguinal nodes Resp: Decreased breath sounds at the left lower chest, bilateral mild expiratory wheeze, scattered and inspiratory rales Cardio: Regular rate and rhythm, 2/6 systolic murmur GI: No hepatosplenic the, nontender, no mass Vascular: 1+ pitting edema at the left greater than right lower leg  Skin: Erythematous maculopapular rash over the lower arm bilaterally    Lab Results:  Lab Results  Component Value Date   WBC 2.3* 08/12/2013   HGB 10.2* 08/12/2013   HCT 31.6* 08/12/2013   MCV 101.3* 08/12/2013   PLT 102* 08/12/2013   NEUTROABS 0.9* 08/12/2013   blood smear: White cells are decreased in number. The neutrophils are hypolobated. No blasts. The platelets appear normal in number. There are ovalocytes,ancanthocytes, and a few teardrops.  Medications: I have reviewed the patient's current medications.  Assessment/Plan: 1. Duodenal mass-status post an EUS guided biopsy 05/30/2013, nondiagnostic cytology 2. Nausea/vomiting/diarrhea  in February 2015       3. pancytopenia -persistent, the MCV is elevated, A. vitamin B 12 level was elevated 03/24/2013       4. COPD        5. renal insufficiency        6. bilateral forearm rash        7. leg edema        8. Constipation relieved with milk of magnesia  Disposition:  Mr. Space has an abdominal/duodenal mass. He appears asymptomatic from the mass. 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 He agrees to a repeat abdomen CT to see if the mass has enlarged and to look for new findings. His constipation is most likely unrelated to the mass.  He has persistent pancytopenia with an elevated MCV. This most likely reflects myelodysplasia. I have a low clinical suspicion for a hematologic malignancy, but this is possible. He does not wish to undergo a bone marrow biopsy. We will check a serum protein electrophoresis/immunofixation.  I discussed the situation with his daughter. He does not wish to schedule a followup visit until after the CT results are available.  Betsy Coder, MD  08/12/2013  1:49 PM

## 2013-08-12 NOTE — Telephone Encounter (Signed)
Sent pt to  lab for today

## 2013-08-14 ENCOUNTER — Telehealth: Payer: Self-pay | Admitting: Medical Oncology

## 2013-08-14 LAB — SPEP & IFE WITH QIG
Albumin ELP: 54.1 % — ABNORMAL LOW (ref 55.8–66.1)
Alpha-1-Globulin: 4.4 % (ref 2.9–4.9)
Alpha-2-Globulin: 10.5 % (ref 7.1–11.8)
BETA 2: 6.5 % (ref 3.2–6.5)
Beta Globulin: 5.3 % (ref 4.7–7.2)
GAMMA GLOBULIN: 19.2 % — AB (ref 11.1–18.8)
IGM, SERUM: 73 mg/dL (ref 41–251)
IgA: 423 mg/dL — ABNORMAL HIGH (ref 68–379)
IgG (Immunoglobin G), Serum: 1410 mg/dL (ref 650–1600)
Total Protein, Serum Electrophoresis: 7.4 g/dL (ref 6.0–8.3)

## 2013-08-14 LAB — KAPPA/LAMBDA LIGHT CHAINS
Kappa free light chain: 5.36 mg/dL — ABNORMAL HIGH (ref 0.33–1.94)
Kappa:Lambda Ratio: 1.08 (ref 0.26–1.65)
Lambda Free Lght Chn: 4.96 mg/dL — ABNORMAL HIGH (ref 0.57–2.63)

## 2013-08-14 NOTE — Telephone Encounter (Signed)
I called Donna-daughter and left a message to inform her that Dr. Benay Spice thinks the blood changes may be secondary to myelodysplasia or a heme malignancy. Pt need a bone marrow to make a diagnosis. Dr. Benay Spice is willing to set up an appointment once we have the CT results. I asked her to call if any questions or concerns.

## 2013-08-27 ENCOUNTER — Ambulatory Visit (HOSPITAL_COMMUNITY)
Admission: RE | Admit: 2013-08-27 | Discharge: 2013-08-27 | Disposition: A | Discharge status: Home / Self Care | Payer: Medicare Other | Source: Ambulatory Visit | Attending: Oncology | Admitting: Oncology

## 2013-08-27 DIAGNOSIS — N210 Calculus in bladder: Secondary | ICD-10-CM | POA: Diagnosis not present

## 2013-08-27 DIAGNOSIS — K5730 Diverticulosis of large intestine without perforation or abscess without bleeding: Secondary | ICD-10-CM | POA: Diagnosis not present

## 2013-08-27 DIAGNOSIS — M51379 Other intervertebral disc degeneration, lumbosacral region without mention of lumbar back pain or lower extremity pain: Secondary | ICD-10-CM | POA: Insufficient documentation

## 2013-08-27 DIAGNOSIS — N269 Renal sclerosis, unspecified: Secondary | ICD-10-CM | POA: Insufficient documentation

## 2013-08-27 DIAGNOSIS — K639 Disease of intestine, unspecified: Secondary | ICD-10-CM | POA: Insufficient documentation

## 2013-08-27 DIAGNOSIS — D696 Thrombocytopenia, unspecified: Secondary | ICD-10-CM | POA: Diagnosis not present

## 2013-08-27 DIAGNOSIS — K4090 Unilateral inguinal hernia, without obstruction or gangrene, not specified as recurrent: Secondary | ICD-10-CM | POA: Insufficient documentation

## 2013-08-27 DIAGNOSIS — N189 Chronic kidney disease, unspecified: Secondary | ICD-10-CM | POA: Diagnosis not present

## 2013-08-27 DIAGNOSIS — M899 Disorder of bone, unspecified: Secondary | ICD-10-CM | POA: Diagnosis not present

## 2013-08-27 DIAGNOSIS — R1900 Intra-abdominal and pelvic swelling, mass and lump, unspecified site: Secondary | ICD-10-CM

## 2013-08-27 DIAGNOSIS — I700 Atherosclerosis of aorta: Secondary | ICD-10-CM | POA: Insufficient documentation

## 2013-08-27 DIAGNOSIS — M5137 Other intervertebral disc degeneration, lumbosacral region: Secondary | ICD-10-CM | POA: Diagnosis not present

## 2013-08-27 DIAGNOSIS — M949 Disorder of cartilage, unspecified: Secondary | ICD-10-CM | POA: Diagnosis not present

## 2013-08-27 DIAGNOSIS — I517 Cardiomegaly: Secondary | ICD-10-CM | POA: Diagnosis not present

## 2013-08-27 DIAGNOSIS — I2510 Atherosclerotic heart disease of native coronary artery without angina pectoris: Secondary | ICD-10-CM | POA: Diagnosis not present

## 2013-08-30 ENCOUNTER — Telehealth: Payer: Self-pay | Admitting: *Deleted

## 2013-08-30 NOTE — Telephone Encounter (Signed)
Per Dr. 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  Daughter verbalized understanding of information and agreed to f/u appt to discuss next steps.

## 2013-08-30 NOTE — Telephone Encounter (Signed)
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 To discuss CT and opitons for biopsy if she agrees ------

## 2013-09-06 ENCOUNTER — Emergency Department (HOSPITAL_COMMUNITY): Payer: Medicare Other

## 2013-09-06 ENCOUNTER — Telehealth: Payer: Self-pay | Admitting: Oncology

## 2013-09-06 ENCOUNTER — Encounter (HOSPITAL_COMMUNITY): Payer: Self-pay | Admitting: Emergency Medicine

## 2013-09-06 DIAGNOSIS — I5043 Acute on chronic combined systolic (congestive) and diastolic (congestive) heart failure: Secondary | ICD-10-CM

## 2013-09-06 DIAGNOSIS — Z87891 Personal history of nicotine dependence: Secondary | ICD-10-CM

## 2013-09-06 DIAGNOSIS — I214 Non-ST elevation (NSTEMI) myocardial infarction: Secondary | ICD-10-CM | POA: Diagnosis present

## 2013-09-06 DIAGNOSIS — N19 Unspecified kidney failure: Secondary | ICD-10-CM

## 2013-09-06 DIAGNOSIS — R34 Anuria and oliguria: Secondary | ICD-10-CM | POA: Diagnosis not present

## 2013-09-06 DIAGNOSIS — N179 Acute kidney failure, unspecified: Secondary | ICD-10-CM | POA: Diagnosis present

## 2013-09-06 DIAGNOSIS — K639 Disease of intestine, unspecified: Secondary | ICD-10-CM | POA: Diagnosis present

## 2013-09-06 DIAGNOSIS — J9601 Acute respiratory failure with hypoxia: Secondary | ICD-10-CM

## 2013-09-06 DIAGNOSIS — J9600 Acute respiratory failure, unspecified whether with hypoxia or hypercapnia: Secondary | ICD-10-CM | POA: Diagnosis present

## 2013-09-06 DIAGNOSIS — Z886 Allergy status to analgesic agent status: Secondary | ICD-10-CM | POA: Diagnosis not present

## 2013-09-06 DIAGNOSIS — F411 Generalized anxiety disorder: Secondary | ICD-10-CM | POA: Diagnosis present

## 2013-09-06 DIAGNOSIS — Z515 Encounter for palliative care: Secondary | ICD-10-CM | POA: Diagnosis not present

## 2013-09-06 DIAGNOSIS — I2109 ST elevation (STEMI) myocardial infarction involving other coronary artery of anterior wall: Secondary | ICD-10-CM

## 2013-09-06 DIAGNOSIS — I5021 Acute systolic (congestive) heart failure: Secondary | ICD-10-CM

## 2013-09-06 DIAGNOSIS — N189 Chronic kidney disease, unspecified: Secondary | ICD-10-CM | POA: Diagnosis present

## 2013-09-06 DIAGNOSIS — Z7902 Long term (current) use of antithrombotics/antiplatelets: Secondary | ICD-10-CM | POA: Diagnosis not present

## 2013-09-06 DIAGNOSIS — I509 Heart failure, unspecified: Secondary | ICD-10-CM | POA: Diagnosis present

## 2013-09-06 DIAGNOSIS — J4489 Other specified chronic obstructive pulmonary disease: Secondary | ICD-10-CM | POA: Diagnosis present

## 2013-09-06 DIAGNOSIS — N390 Urinary tract infection, site not specified: Secondary | ICD-10-CM | POA: Diagnosis present

## 2013-09-06 DIAGNOSIS — M109 Gout, unspecified: Secondary | ICD-10-CM | POA: Diagnosis present

## 2013-09-06 DIAGNOSIS — J449 Chronic obstructive pulmonary disease, unspecified: Secondary | ICD-10-CM | POA: Diagnosis present

## 2013-09-06 DIAGNOSIS — Z79899 Other long term (current) drug therapy: Secondary | ICD-10-CM | POA: Diagnosis not present

## 2013-09-06 DIAGNOSIS — I2102 ST elevation (STEMI) myocardial infarction involving left anterior descending coronary artery: Secondary | ICD-10-CM

## 2013-09-06 DIAGNOSIS — Z66 Do not resuscitate: Secondary | ICD-10-CM | POA: Diagnosis present

## 2013-09-06 DIAGNOSIS — I080 Rheumatic disorders of both mitral and aortic valves: Secondary | ICD-10-CM | POA: Diagnosis present

## 2013-09-06 DIAGNOSIS — K5900 Constipation, unspecified: Secondary | ICD-10-CM | POA: Diagnosis present

## 2013-09-06 DIAGNOSIS — I213 ST elevation (STEMI) myocardial infarction of unspecified site: Secondary | ICD-10-CM

## 2013-09-06 LAB — BASIC METABOLIC PANEL
Anion gap: 14 (ref 5–15)
BUN: 54 mg/dL — ABNORMAL HIGH (ref 6–23)
CALCIUM: 8.8 mg/dL (ref 8.4–10.5)
CO2: 22 meq/L (ref 19–32)
CREATININE: 2.38 mg/dL — AB (ref 0.50–1.35)
Chloride: 105 mEq/L (ref 96–112)
GFR calc non Af Amer: 22 mL/min — ABNORMAL LOW (ref >90)
GFR, EST AFRICAN AMERICAN: 26 mL/min — AB (ref >90)
Glucose, Bld: 137 mg/dL — ABNORMAL HIGH (ref 70–99)
Potassium: 5 mEq/L (ref 3.7–5.3)
Sodium: 141 mEq/L (ref 137–147)

## 2013-09-06 LAB — CBC WITH DIFFERENTIAL/PLATELET
BASOS ABS: 0 10*3/uL (ref 0.0–0.1)
BASOS PCT: 0 % (ref 0–1)
EOS PCT: 0 % (ref 0–5)
Eosinophils Absolute: 0 10*3/uL (ref 0.0–0.7)
HCT: 28.2 % — ABNORMAL LOW (ref 39.0–52.0)
Hemoglobin: 9 g/dL — ABNORMAL LOW (ref 13.0–17.0)
Lymphocytes Relative: 21 % (ref 12–46)
Lymphs Abs: 0.7 10*3/uL (ref 0.7–4.0)
MCH: 32.3 pg (ref 26.0–34.0)
MCHC: 31.9 g/dL (ref 30.0–36.0)
MCV: 101.1 fL — AB (ref 78.0–100.0)
Monocytes Absolute: 0.5 10*3/uL (ref 0.1–1.0)
Monocytes Relative: 13 % — ABNORMAL HIGH (ref 3–12)
NEUTROS ABS: 2.2 10*3/uL (ref 1.7–7.7)
Neutrophils Relative %: 65 % (ref 43–77)
Platelets: 86 10*3/uL — ABNORMAL LOW (ref 150–400)
RBC: 2.79 MIL/uL — ABNORMAL LOW (ref 4.22–5.81)
RDW: 15.7 % — AB (ref 11.5–15.5)
WBC: 3.4 10*3/uL — AB (ref 4.0–10.5)

## 2013-09-06 LAB — TROPONIN I: TROPONIN I: 11.51 ng/mL — AB (ref <0.30)

## 2013-09-06 LAB — I-STAT TROPONIN, ED: Troponin i, poc: 3.96 ng/mL (ref 0.00–0.08)

## 2013-09-06 LAB — I-STAT ARTERIAL BLOOD GAS, ED
Acid-base deficit: 9 mmol/L — ABNORMAL HIGH (ref 0.0–2.0)
Bicarbonate: 19 mEq/L — ABNORMAL LOW (ref 20.0–24.0)
O2 Saturation: 99 %
TCO2: 20 mmol/L (ref 0–100)
pCO2 arterial: 48.9 mmHg — ABNORMAL HIGH (ref 35.0–45.0)
pH, Arterial: 7.196 — CL (ref 7.350–7.450)
pO2, Arterial: 141 mmHg — ABNORMAL HIGH (ref 80.0–100.0)

## 2013-09-06 LAB — URINALYSIS, ROUTINE W REFLEX MICROSCOPIC
Glucose, UA: 100 mg/dL — AB
Ketones, ur: 15 mg/dL — AB
Nitrite: POSITIVE — AB
Specific Gravity, Urine: 1.028 (ref 1.005–1.030)
Urobilinogen, UA: 1 mg/dL (ref 0.0–1.0)
pH: 5 (ref 5.0–8.0)

## 2013-09-06 LAB — PRO B NATRIURETIC PEPTIDE: Pro B Natriuretic peptide (BNP): 21760 pg/mL — ABNORMAL HIGH (ref 0–450)

## 2013-09-06 LAB — MRSA PCR SCREENING: MRSA BY PCR: NEGATIVE

## 2013-09-06 LAB — URINE MICROSCOPIC-ADD ON

## 2013-09-06 LAB — HEPARIN LEVEL (UNFRACTIONATED): HEPARIN UNFRACTIONATED: 0.49 [IU]/mL (ref 0.30–0.70)

## 2013-09-06 LAB — PROTIME-INR
INR: 1.25 (ref 0.00–1.49)
Prothrombin Time: 15.7 seconds — ABNORMAL HIGH (ref 11.6–15.2)

## 2013-09-06 LAB — APTT: aPTT: 71 seconds — ABNORMAL HIGH (ref 24–37)

## 2013-09-06 MED ORDER — LEVALBUTEROL HCL 0.63 MG/3ML IN NEBU
INHALATION_SOLUTION | RESPIRATORY_TRACT | Status: AC
  Filled 2013-09-06: qty 6

## 2013-09-06 MED ORDER — IPRATROPIUM-ALBUTEROL 0.5-2.5 (3) MG/3ML IN SOLN
3.0000 mL | Freq: Once | RESPIRATORY_TRACT | Status: AC
  Administered 2013-09-06: 3 mL via RESPIRATORY_TRACT

## 2013-09-06 MED ORDER — LORAZEPAM 2 MG/ML IJ SOLN
0.5000 mg | Freq: Once | INTRAMUSCULAR | Status: AC
  Administered 2013-09-06: 0.5 mg via INTRAVENOUS
  Filled 2013-09-06: qty 1

## 2013-09-06 MED ORDER — ALBUTEROL (5 MG/ML) CONTINUOUS INHALATION SOLN
10.0000 mg/h | INHALATION_SOLUTION | Freq: Once | RESPIRATORY_TRACT | Status: DC
  Filled 2013-09-06: qty 20

## 2013-09-06 MED ORDER — TAMSULOSIN HCL 0.4 MG PO CAPS
0.4000 mg | ORAL_CAPSULE | Freq: Every day | ORAL | Status: DC
  Filled 2013-09-06 (×2): qty 1

## 2013-09-06 MED ORDER — LEVALBUTEROL HCL 0.63 MG/3ML IN NEBU
INHALATION_SOLUTION | RESPIRATORY_TRACT | Status: AC
  Filled 2013-09-06: qty 3

## 2013-09-06 MED ORDER — PILOCARPINE HCL 4 % OP SOLN
1.0000 [drp] | Freq: Four times a day (QID) | OPHTHALMIC | Status: DC
  Administered 2013-09-06 (×2): 1 [drp] via OPHTHALMIC
  Filled 2013-09-06: qty 15

## 2013-09-06 MED ORDER — IPRATROPIUM-ALBUTEROL 0.5-2.5 (3) MG/3ML IN SOLN
3.0000 mL | Freq: Once | RESPIRATORY_TRACT | Status: AC
  Administered 2013-09-06: 3 mL via RESPIRATORY_TRACT
  Filled 2013-09-06: qty 3

## 2013-09-06 MED ORDER — ACETAMINOPHEN 325 MG PO TABS: 650.0000 mg | ORAL_TABLET | ORAL | Status: DC | PRN

## 2013-09-06 MED ORDER — FUROSEMIDE 10 MG/ML IJ SOLN
40.0000 mg | Freq: Once | INTRAMUSCULAR | Status: AC
  Administered 2013-09-06: 40 mg via INTRAVENOUS
  Filled 2013-09-06: qty 4

## 2013-09-06 MED ORDER — ASPIRIN EC 81 MG PO TBEC: 81.0000 mg | DELAYED_RELEASE_TABLET | Freq: Every day | ORAL | Status: DC

## 2013-09-06 MED ORDER — NITROGLYCERIN 0.4 MG SL SUBL: 0.4000 mg | SUBLINGUAL_TABLET | SUBLINGUAL | Status: DC | PRN

## 2013-09-06 MED ORDER — FUROSEMIDE 10 MG/ML IJ SOLN
40.0000 mg | Freq: Two times a day (BID) | INTRAMUSCULAR | Status: DC
  Filled 2013-09-06 (×2): qty 4

## 2013-09-06 MED ORDER — ATORVASTATIN CALCIUM 40 MG PO TABS
40.0000 mg | ORAL_TABLET | Freq: Every day | ORAL | Status: DC
  Filled 2013-09-06 (×2): qty 1

## 2013-09-06 MED ORDER — CARVEDILOL 3.125 MG PO TABS
3.1250 mg | ORAL_TABLET | Freq: Two times a day (BID) | ORAL | Status: DC
  Filled 2013-09-06 (×6): qty 1

## 2013-09-06 MED ORDER — HALOPERIDOL LACTATE 5 MG/ML IJ SOLN
5.0000 mg | Freq: Four times a day (QID) | INTRAMUSCULAR | Status: DC | PRN
  Administered 2013-09-06 – 2013-09-07 (×3): 5 mg via INTRAVENOUS
  Filled 2013-09-06 (×3): qty 1

## 2013-09-06 MED ORDER — NITROGLYCERIN IN D5W 200-5 MCG/ML-% IV SOLN
INTRAVENOUS | Status: AC
  Filled 2013-09-06: qty 250

## 2013-09-06 MED ORDER — LEVALBUTEROL HCL 1.25 MG/0.5ML IN NEBU
1.2500 mg | INHALATION_SOLUTION | Freq: Four times a day (QID) | RESPIRATORY_TRACT | Status: DC
  Filled 2013-09-06 (×4): qty 0.5

## 2013-09-06 MED ORDER — HEPARIN BOLUS VIA INFUSION
4000.0000 [IU] | Freq: Once | INTRAVENOUS | Status: AC
  Administered 2013-09-06: 4000 [IU] via INTRAVENOUS
  Filled 2013-09-06: qty 4000

## 2013-09-06 MED ORDER — FUROSEMIDE 10 MG/ML IJ SOLN
40.0000 mg | Freq: Once | INTRAMUSCULAR | Status: AC
  Administered 2013-09-06: 40 mg via INTRAVENOUS

## 2013-09-06 MED ORDER — CLOPIDOGREL BISULFATE 75 MG PO TABS
75.0000 mg | ORAL_TABLET | Freq: Every day | ORAL | Status: DC
  Filled 2013-09-06: qty 1

## 2013-09-06 MED ORDER — NITROGLYCERIN IN D5W 200-5 MCG/ML-% IV SOLN: 2.0000 ug/min | INTRAVENOUS | Status: DC

## 2013-09-06 MED ORDER — LEVALBUTEROL HCL 1.25 MG/0.5ML IN NEBU
1.2500 mg | INHALATION_SOLUTION | Freq: Four times a day (QID) | RESPIRATORY_TRACT | Status: DC
  Administered 2013-09-06 – 2013-09-07 (×6): 1.25 mg via RESPIRATORY_TRACT
  Filled 2013-09-06 (×10): qty 0.5

## 2013-09-06 MED ORDER — TRAVOPROST (BAK FREE) 0.004 % OP SOLN
1.0000 [drp] | Freq: Every day | OPHTHALMIC | Status: DC
  Filled 2013-09-06: qty 2.5

## 2013-09-06 MED ORDER — FUROSEMIDE 10 MG/ML IJ SOLN
INTRAMUSCULAR | Status: AC
  Filled 2013-09-06: qty 4

## 2013-09-06 MED ORDER — LEVALBUTEROL HCL 0.63 MG/3ML IN NEBU
0.6300 mg | INHALATION_SOLUTION | Freq: Once | RESPIRATORY_TRACT | Status: AC
  Administered 2013-09-06: 08:00:00 via RESPIRATORY_TRACT

## 2013-09-06 MED ORDER — HEPARIN (PORCINE) IN NACL 100-0.45 UNIT/ML-% IJ SOLN
1000.0000 [IU]/h | INTRAMUSCULAR | Status: DC
  Administered 2013-09-06 (×2): 1000 [IU]/h via INTRAVENOUS
  Filled 2013-09-06 (×3): qty 250

## 2013-09-06 MED ORDER — LORAZEPAM 2 MG/ML IJ SOLN
1.0000 mg | INTRAMUSCULAR | Status: DC | PRN
  Administered 2013-09-06 – 2013-09-07 (×4): 1 mg via INTRAVENOUS
  Filled 2013-09-06 (×4): qty 1

## 2013-09-06 MED ORDER — ONDANSETRON HCL 4 MG/2ML IJ SOLN: 4.0000 mg | Freq: Four times a day (QID) | INTRAMUSCULAR | Status: DC | PRN

## 2013-09-06 NOTE — ED Notes (Signed)
MD at bedside talking with daughter on the phone. Pt is in respiratory distress, wheezing noted, pt restless in bed.

## 2013-09-06 NOTE — ED Notes (Signed)
Respiratory at bedside.

## 2013-09-06 NOTE — Progress Notes (Signed)
Pt. placed back on bipap after haldol given, safety mittens placed with sitter in room, tolerating well, RT to monitor.

## 2013-09-06 NOTE — ED Notes (Signed)
Patient transported to X-ray 

## 2013-09-06 NOTE — Telephone Encounter (Signed)
Spk w/pt per 07/24 POF scheduled w/Dr. mailed new schedule.Marland Kitchen..KJ

## 2013-09-06 NOTE — ED Notes (Signed)
MD at bedside. 

## 2013-09-06 NOTE — ED Provider Notes (Signed)
9:28 AM Pt transferred from [facility] ED with NSTEMI, acute CHF excacerbation and acute resp failure. Upon arrival, pt aggitated, in acute resp distress w/ tachycardia, tachypnea, and hypoxia on NRB with diffuse wheezing and poor air mvmt. He has been placed back on bipap. Another 40mg  IV lasix ordered and pt was started on NTG gtt, however, BP did not tolerate this.  Continuous albuterol neb ordered. Pt's WOB inproved with bipap.  I have discussion with pt's son-in-law and lengthy discussion over the phone w/ pt's daughter. He is DNR, and now also DNI, though is otherwise full care at this point. Spoke w/ Dr. Meda Coffee of cardiology, a bed is ready in step-down unit and he will be admitted.    1. Non-STEMI (non-ST elevated myocardial infarction)   2. Acute renal failure, unspecified acute renal failure type   3. Acute systolic congestive heart failure   4. Acute on chronic systolic and diastolic heart failure, NYHA class 4   5. Acute respiratory failure with hypoxia   6. ST elevation myocardial infarction involving left anterior descending (LAD) coronary artery      CRITICAL CARE Performed by: Ernestina Patches, E Total critical care time: 46min Critical care time was exclusive of separately billable procedures and treating other patients. Critical care was necessary to treat or prevent imminent or life-threatening deterioration. Critical care was time spent personally by me on the following activities: development of treatment plan with patient and/or surrogate as well as nursing, discussions with consultants, evaluation of patient's response to treatment, examination of patient, obtaining history from patient or surrogate, ordering and performing treatments and interventions, ordering and review of laboratory studies, ordering and review of radiographic studies, pulse oximetry and re-evaluation of patient's condition.   Neta Ehlers, MD 08/10/2013 [phone]

## 2013-09-06 NOTE — ED Notes (Signed)
[facility] at bedside 

## 2013-09-06 NOTE — ED Notes (Signed)
Pt arrives by EMS-complaints of weakness and fatigue for the past few days, associated with some shortness of breath.  Patient had a chest x-ray yesterday morning and resulted at 1800 last night with bilateral pneumonia.  Patient has complained of increasing shortness of breath through the night.  Patient started on antibiotics and nebulizer treatments with no relief of shortness of breath.

## 2013-09-06 NOTE — ED Notes (Signed)
Cardiology at bedside.

## 2013-09-06 NOTE — Progress Notes (Signed)
Pt. placed back on bipap after haldol given, safety mittens placed with sitter in room, tolerating well, RN made aware, RT to monitor.

## 2013-09-06 NOTE — Progress Notes (Signed)
ANTICOAGULATION CONSULT NOTE - Initial Consult  Pharmacy Consult for Heparin Indication: chest pain/ACS  Allergies  Allergen Reactions  . Aspirin Swelling    Face and eyes    Patient Measurements: Height: 6\' 1"  (185.4 cm) Weight: 184 lb (83.462 kg) IBW/kg (Calculated) : 79.9 Last documented weight 83.5 kg (08/11/13) Heparin Dosing Weight: actual weight  Vital Signs: Temp: 98.4 F (36.9 C) (07/31 0312) Temp src: Oral (07/31 0312) BP: 121/70 mmHg (07/31 0312) Pulse Rate: 99 (07/31 0312)  Labs:  Recent Labs  08/19/2013 0538  HGB 9.0*  HCT 28.2*  PLT PENDING  CREATININE 2.38*    Estimated Creatinine Clearance: 22.4 ml/min (by C-G formula based on Cr of 2.38).   Medical History: Past Medical History  Diagnosis Date  . Pancytopenia     caused by medications he is no longer taking  . Glaucoma   . COPD (chronic obstructive pulmonary disease)   . Mitral regurgitation   . Gout   . Colon polyps   . Cancer     skin  . Shingles   . Syncope   . Onychomycosis     fungal  . Shoulder fracture   . Heart murmur   . Decreased ambulation status     ambulates with walker  . Constipation   . Chronic kidney disease     Medications:  Scheduled:   Infusions:  . heparin    . heparin      Assessment: 71 yoM admitted 7/31 with c/o weakness, fatigue and SOB, r/o ACS.  Pharmacy consulted to dose heparin drip.  CBC:  Hgb 9, Plt pending  SCr 2.38, CrCl ~ 22  Baseline coags ordered/pending  Troponin: 3.96   Goal of Therapy:  Heparin level 0.3-0.7 units/ml Monitor platelets by anticoagulation protocol: Yes   Plan:   Give heparin 4000 units bolus IV x 1  Start heparin IV infusion at 1000 units/hr  Heparin level 8 hours after starting  Daily heparin level and CBC  Continue to monitor H&H and platelets   Gretta Arab PharmD, BCPS Pager [phone] 08/14/2013 6:30 AM

## 2013-09-06 NOTE — ED Notes (Signed)
Pt on bipap. Tolerating well.

## 2013-09-06 NOTE — H&P (Signed)
Patient ID: Colin Hudson MRN: [id number], DOB/AGE: 06/29/1918   Admit date: 09/05/2013  Primary Physician: Estill Dooms, MD Primary Cardiologist: Fransico Him, MD  Pt. Profile:  SOB, abnormal ECG  Problem List  Past Medical History  Diagnosis Date  . Pancytopenia     caused by medications he is no longer taking  . Glaucoma   . COPD (chronic obstructive pulmonary disease)   . Mitral regurgitation   . Gout   . Colon polyps   . Cancer     skin  . Shingles   . Syncope   . Onychomycosis     fungal  . Shoulder fracture   . Heart murmur   . Decreased ambulation status     ambulates with walker  . Constipation   . Chronic kidney disease     Past Surgical History  Procedure Laterality Date  . Tonsillectomy    . Adenoidectomy    . Eye surgery      laser-glaucoma  . Eye surgery      cataract  . Eus N/A 05/30/2013    Procedure: UPPER ENDOSCOPIC ULTRASOUND (EUS) LINEAR;  Surgeon: Milus Banister, MD;  Location: [facility] END[facility];  Service: Endoscopy;  Laterality: N/A;     Allergies  Allergies  Allergen Reactions  . Aspirin Swelling    Face and eyes    HPI  Patient is a 80 y.o. male  Who lives in an assisted living facility, with a PMHx of COPD and combined systolic and diastolic CHF with AR and MR, RV dysfunction who presented with progressively worsening SOB. He states he has had worsening shortness of breath over the last 3 days. It became moderate to severe this morning and he was brought by EMS. He states he has also been weak with no appetite during this time. He denies chest pain. He denies nausea, vomiting or diarrhea. He was wheezing on arrival and was given a DuoNeb treatment. He continues to be tachypneic and complaining of shortness of breath as well as anxiety. He was started on BiPAP that he is not tolerating. He received 40 mg of iv Lasix.  ECG was performed and showed STE in aVR, V1 and reciprocal STD in all other leads, it was normal in April 2015.  The first troponin is 3.96. Dr Martinique was called and it was decided not to take the patient to the cath lab. Crea is 2.38, previously 1.5 on 08/12/13. The patient states that he has had LE edema for the last month.   The patient was admitted in 2/15-2/19/15 when he found down on the floor in his independent living facility. He had had diarrhea, nausea and vomiting for several days. Cardiac markers were obtained. These were elevated consistent with non-STEMI. Abdominal CT demonstrated duodenal mass. He was seen by cardiology. Echocardiogram demonstrated mildly depressed LV function with an EF of 45-50%, moderate aortic insufficiency and mild to moderate mitral regurgitation. CPK was also elevated and this was thought to possibly represent rhabdomyolysis. Troponins were elevated in a fairly flat pattern. He had no wall motion abnormalities on echocardiogram. It was felt that this was likely a type II non-STEMI. He was also noted to have a possible punctate acute or subacute left parietal infarct on MRI. Conservative management was recommended given his advanced age and co-morbidities. He was seen by palliative care. He underwent EGD, but biopsy of the duodenal mass was non-diagnostic. Oncology suspects gastrointestinal stromal tumor.  He is followed by Dr Radford Pax, the last seen by  Richardson Dopp in April 2015  Home Medications  Prior to Admission medications   Medication Sig Start Date End Date Taking? Authorizing Provider  clopidogrel (PLAVIX) 75 MG tablet Take 75 mg by mouth daily with breakfast.   Yes Historical Provider, MD  furosemide (LASIX) 20 MG tablet 1/2 tablet ( 10mg ) Monday, Wednesday, & Friday only 05/23/13  Yes Scott T Weaver, PA-C  ipratropium-albuterol (DUONEB) 0.5-2.5 (3) MG/3ML SOLN Take 3 mLs by nebulization every 6 (six) hours as needed (for wheezing).   Yes Historical Provider, MD  losartan (COZAAR) 25 MG tablet Take 25 mg by mouth every morning.   Yes Historical Provider, MD  metoprolol  tartrate (LOPRESSOR) 25 MG tablet Take 12.5 mg by mouth 2 (two) times daily.  05/23/13  Yes Scott T Kathlen Mody, PA-C  moxifloxacin (AVELOX) 400 MG tablet Take 400 mg by mouth daily at 8 pm.   Yes Historical Provider, MD  pilocarpine (PILOCAR) 4 % ophthalmic solution Place 1 drop into both eyes 4 (four) times daily.   Yes Historical Provider, MD  Polyethyl Glycol-Propyl Glycol (SYSTANE) 0.4-0.3 % SOLN Apply 1 drop to eye daily as needed (for dry eyes).   Yes Historical Provider, MD  senna (SENOKOT) 8.6 MG tablet Take 1 tablet by mouth 2 (two) times daily.    Yes Historical Provider, MD  tamsulosin (FLOMAX) 0.4 MG CAPS capsule Take 0.4 mg by mouth daily.   Yes Historical Provider, MD  Travoprost (TRAVATAN Z OP) Place 1 drop into both eyes at bedtime.    Yes Historical Provider, MD    Family History  Family History  Problem Relation Age of Onset  . CAD Brother     Social History  History   Social History  . Marital Status: Widowed    Spouse Name: N/A    Number of Children: N/A  . Years of Education: N/A   Occupational History  . Not on file.   Social History Main Topics  . Smoking status: Former Research scientist (life sciences)  . Smokeless tobacco: Not on file  . Alcohol Use: No  . Drug Use: No  . Sexual Activity: No   Other Topics Concern  . Not on file   Social History Narrative  . No narrative on file     Review of Systems General:  No chills, fever, night sweats or weight changes.  Cardiovascular:  No chest pain, dyspnea on exertion, edema, orthopnea, palpitations, paroxysmal nocturnal dyspnea. Dermatological: No rash, lesions/masses Respiratory: No cough, dyspnea Urologic: No hematuria, dysuria Abdominal:   No nausea, vomiting, diarrhea, bright red blood per rectum, melena, or hematemesis Neurologic:  No visual changes, wkns, changes in mental status. All other systems reviewed and are otherwise negative except as noted above.  Physical Exam  Blood pressure 116/73, pulse 127, temperature  98.4 F (36.9 C), temperature source Oral, resp. rate 31, height 6\' 1"  (1.854 m), weight 184 lb (83.462 kg), SpO2 95.00%.  General: in acute distress, SOB Psych: Normal affect. Neuro: Alert and oriented X 3. Moves all extremities spontaneously. HEENT: Normal  Neck: Supple without bruits or JVD. Lungs:  Resp regular and unlabored, crackles B/L. Heart: RRR no s3, s4, systolic + diastolic murmurs. Abdomen: Soft, non-tender, non-distended, BS + x 4.  Extremities: No clubbing, cyanosis, 2+ B/L pitting edema. DP/PT/Radials 2+ and equal bilaterally.  Labs  No results found for this basename: CKTOTAL, CKMB, TROPONINI,  in the last 72 hours Lab Results  Component Value Date   WBC 3.4* 09/03/2013   HGB 9.0* 08/12/2013  HCT 28.2* 08/18/2013   MCV 101.1* 08/24/2013   PLT 86* 08/26/2013    Recent Labs Lab 08/20/2013 0538  NA 141  K 5.0  CL 105  CO2 22  BUN 54*  CREATININE 2.38*  CALCIUM 8.8  GLUCOSE 137*   Lab Results  Component Value Date   CHOL 122 03/25/2013   HDL 41 03/25/2013   LDLCALC 69 03/25/2013   TRIG 58 03/25/2013     Radiology/Studies  Ct Abdomen Pelvis Wo Contrast  08/27/2013   CLINICAL DATA:  Thrombocytopenia. Followup of duodenal mass. .  IMPRESSION: 1. Motion degraded exam. 2. Interval enlargement of mass centered along the inferior a genu of the duodenum. Suspicious for GI stromal tumor. No evidence of metastatic disease or complicating obstruction. 3. Left nephrolithiasis. 4. Asbestos related pleural disease. 5.  Atherosclerosis, including within the coronary arteries. 6. Bladder calculus, as before.   Electronically Signed   By: Abigail Miyamoto M.D.   On: 08/27/2013 15:17   Dg Chest 2 View  08/14/2013   CLINICAL DATA:  Increasing shortness of breath.  Pneumonia.Marland Kitchen  IMPRESSION: The increasing pulmonary vascular congestion and perihilar infiltrates suggesting edema. Small bilateral pleural effusions versus pleural thickening. Calcification, scarring, and pleural thickening on  the left lateral chest wall is chronic.   Electronically Signed   By: Lucienne Capers M.D.   On: 08/28/2013 04:07   Echocardiogram - 03/25/2013 Left ventricle: The cavity size was mildly dilated. There was mild concentric hypertrophy. Systolic function was mildly reduced. The estimated ejection fraction was in the range of 45% to 50%. Wall motion was normal; there were no regional wall motion abnormalities. Doppler parameters are consistent with abnormal left ventricular relaxation (grade 1 diastolic dysfunction). Doppler parameters are consistent with elevated ventricular end-diastolic filling pressure. - Aortic valve: Cusp separation was severely reduced. Moderate regurgitation. - Mitral valve: Structurally normal valve. Mild to moderate regurgitation. - Left atrium: The atrium was mildly dilated. - Right ventricle: The cavity size was mildly dilated. Wall thickness was normal. Systolic function was mildly to moderately reduced. - Pulmonary arteries: Systolic pressure was within the normal range. - Inferior vena cava: The vessel was normal in size; the respirophasic diameter changes were in the normal range (= 50%); findings are consistent with normal central venous pressure. - Pericardium, extracardiac: A trivial pericardial effusion was identified.  ECG: STE in aVR, V1 and reciprocal STD in all other leads, it was normal in April 2015    ASSESSMENT AND PLAN  1. STEMI; not a candidate for a cath right now (acute on chronic kidney failure, age, respiratory failure, DNR status), we will continue checking TnI, ECGs and consider cath if his kidney function improves 2. Chronic Combined Systolic and Diastolic CHF, LVEF 56-97% in 2/15: BNP > 20.000, He has volume excess on exam, CXR shows pulmonary edema, we will continue iv diuresis 40 mg Lasix BID 3. Acute respiratory failure - sec to CHF and COPD, continue BiPAP, diuresis and Xopenex,  4. Acute on CKD - hold ARB, avoid contrast  agents for now 5. AR, MR - he will require afterload reduction,  6. Duodenal Mass:  Suspicious for GI stromal tumor, followed by oncology   Signed, Dorothy Spark, MD, [facility] 08/08/2013, 7:59 AM

## 2013-09-06 NOTE — ED Notes (Signed)
Notified EDP,Molpus,MD., pt. i-stat troponin results 3.96.

## 2013-09-06 NOTE — Progress Notes (Signed)
Pt placed on 4 lpm n/c, due to keeps removing bipap mask, 94%, ordered xopenex med for nebs scheduled, plan to give as soon as arrives, rn made aware.

## 2013-09-06 NOTE — Progress Notes (Signed)
Pt placed on BIPAP per MD order for WOB. Pt being transferred to [facility] ED.

## 2013-09-06 NOTE — Progress Notes (Signed)
ANTICOAGULATION CONSULT NOTE - Follow Up Consult  Pharmacy Consult for Heparin Indication: chest pain/ACS  Allergies  Allergen Reactions  . Aspirin Swelling    Face and eyes    Patient Measurements: Height: 6\' 1"  (185.4 cm) Weight: 184 lb (83.462 kg) IBW/kg (Calculated) : 79.9 Last documented weight 83.5 kg (08/11/13)   Vital Signs: Temp: 97.4 F (36.3 C) (07/31 1536) Temp src: Axillary (07/31 1536) BP: 94/71 mmHg (07/31 1800) Pulse Rate: 106 (07/31 1800)  Labs:  Recent Labs  08/26/2013 0538 08/17/2013 0619 09/04/2013 1815  HGB 9.0*  --   --   HCT 28.2*  --   --   PLT 86*  --   --   APTT  --  71*  --   LABPROT  --  15.7*  --   INR  --  1.25  --   HEPARINUNFRC  --   --  0.49  CREATININE 2.38*  --   --     Estimated Creatinine Clearance: 22.4 ml/min (by C-G formula based on Cr of 2.38).   Medical History: Past Medical History  Diagnosis Date  . Pancytopenia     caused by medications he is no longer taking  . Glaucoma   . COPD (chronic obstructive pulmonary disease)   . Mitral regurgitation   . Gout   . Colon polyps   . Cancer     skin  . Shingles   . Syncope   . Onychomycosis     fungal  . Shoulder fracture   . Heart murmur   . Decreased ambulation status     ambulates with walker  . Constipation   . Chronic kidney disease     Medications:  Scheduled:  . albuterol  10 mg/hr Nebulization Once  . atorvastatin  40 mg Oral q1800  . carvedilol  3.125 mg Oral BID WC  . [START ON 09-09-13] clopidogrel  75 mg Oral Q breakfast  . furosemide  40 mg Intravenous BID  . levalbuterol  1.25 mg Nebulization QID  . nitroGLYCERIN      . pilocarpine  1 drop Both Eyes QID  . tamsulosin  0.4 mg Oral Daily  . Travoprost (BAK Free)  1 drop Both Eyes QHS   Infusions:  . heparin 1,000 Units/hr (08/10/2013 0615)  . nitroGLYCERIN Stopped (08/21/2013 5176)    Assessment: 33 yoM admitted 7/31 with c/o weakness, fatigue and SOB, r/o ACS, Tp increased 3.96. Heparin drip  1000 uts/hr HL 0.49 at goal, CBC stable, no bleeding noted.    Goal of Therapy:  Heparin level 0.3-0.7 units/ml Monitor platelets by anticoagulation protocol: Yes   Plan:   Continue heparin IV infusion at 1000 units/hr  Daily heparin level and CBC  Continue to monitor H&H and platelets   Bonnita Nasuti Pharm.D. CPP, BCPS Clinical Pharmacist [phone] 08/10/2013 6:41 PM

## 2013-09-06 NOTE — ED Provider Notes (Addendum)
CSN: [id number]     Arrival date & time 09/05/2013  0303 History   First MD Initiated Contact with Patient 08/30/2013 [phone]     Chief Complaint  Patient presents with  . Shortness of Breath     (Consider location/radiation/quality/duration/timing/severity/associated sxs/prior Treatment) HPI This is a 80 year old with a history of COPD. He states he has had worsening shortness of breath over the last 3 days. It became moderate to severe this morning and he was brought by EMS. He states he has also been weak with no appetite during this time. He denies chest pain. He denies nausea, vomiting or diarrhea. He was wheezing on arrival and was given a DuoNeb treatment. He continues to be tachypneic and complaining of shortness of breath as well as anxiety. He has had swelling in his lower legs for over a month.  He was reportedly diagnosed with pneumonia by chest x-ray 2 days ago and was placed on an antibiotic.  Past Medical History  Diagnosis Date  . Pancytopenia     caused by medications he is no longer taking  . Glaucoma   . COPD (chronic obstructive pulmonary disease)   . Mitral regurgitation   . Gout   . Colon polyps   . Cancer     skin  . Shingles   . Syncope   . Onychomycosis     fungal  . Shoulder fracture   . Heart murmur   . Decreased ambulation status     ambulates with walker  . Constipation   . Chronic kidney disease    Past Surgical History  Procedure Laterality Date  . Tonsillectomy    . Adenoidectomy    . Eye surgery      laser-glaucoma  . Eye surgery      cataract  . Eus N/A 05/30/2013    Procedure: UPPER ENDOSCOPIC ULTRASOUND (EUS) LINEAR;  Surgeon: Milus Banister, MD;  Location: [facility];  Service: Endoscopy;  Laterality: N/A;   Family History  Problem Relation Age of Onset  . CAD Brother    History  Substance Use Topics  . Smoking status: Former Research scientist (life sciences)  . Smokeless tobacco: Not on file  . Alcohol Use: No    Review of Systems  All other systems  reviewed and are negative.   Allergies  Aspirin  Home Medications   Prior to Admission medications   Medication Sig Start Date End Date Taking? Authorizing Provider  clopidogrel (PLAVIX) 75 MG tablet Take 75 mg by mouth daily with breakfast.   Yes Historical Provider, MD  furosemide (LASIX) 20 MG tablet 1/2 tablet ( 10mg ) Monday, Wednesday, & Friday only 05/23/13  Yes Scott T Weaver, PA-C  ipratropium-albuterol (DUONEB) 0.5-2.5 (3) MG/3ML SOLN Take 3 mLs by nebulization every 6 (six) hours as needed (for wheezing).   Yes Historical Provider, MD  losartan (COZAAR) 25 MG tablet Take 25 mg by mouth every morning.   Yes Historical Provider, MD  metoprolol tartrate (LOPRESSOR) 25 MG tablet Take 12.5 mg by mouth 2 (two) times daily.  05/23/13  Yes Scott T Kathlen Mody, PA-C  moxifloxacin (AVELOX) 400 MG tablet Take 400 mg by mouth daily at 8 pm.   Yes Historical Provider, MD  pilocarpine (PILOCAR) 4 % ophthalmic solution Place 1 drop into both eyes 4 (four) times daily.   Yes Historical Provider, MD  Polyethyl Glycol-Propyl Glycol (SYSTANE) 0.4-0.3 % SOLN Apply 1 drop to eye daily as needed (for dry eyes).   Yes Historical Provider, MD  senna Donavan Burnet)  8.6 MG tablet Take 1 tablet by mouth 2 (two) times daily.    Yes Historical Provider, MD  tamsulosin (FLOMAX) 0.4 MG CAPS capsule Take 0.4 mg by mouth daily.   Yes Historical Provider, MD  Travoprost (TRAVATAN Z OP) Place 1 drop into both eyes at bedtime.    Yes Historical Provider, MD   BP 121/70  Pulse 99  Temp(Src) 98.4 F (36.9 C) (Oral)  Resp 28  Ht 6\' 1"  (1.854 m)  Wt 184 lb (83.462 kg)  BMI 24.28 kg/m2  SpO2 99%  Physical Exam General: Well-developed, well-nourished male in no acute distress; appearance consistent with age of record HENT: normocephalic; atraumatic Eyes: pupils equal, round and reactive to light; extraocular muscles intact; lens implant Neck: supple Heart: regular rate and rhythm; distant sound Lungs: Tachypneic; rales in  bases Abdomen: soft; nondistended; nontender; no masses or hepatosplenomegaly; bowel sounds present Extremities: No deformity; full range of motion; 1+ edema of the lower leg Neurologic: Awake, alert and oriented; motor function intact in all extremities and symmetric; no facial droop Skin: Warm and dry; pale Psychiatric: Anxious    ED Course  Procedures (including critical care time)  CRITICAL CARE Performed by: Silvia Hightower L Total critical care time: 60 minutes Critical care time was exclusive of separately billable procedures and treating other patients. Critical care was necessary to treat or prevent imminent or life-threatening deterioration. Critical care was time spent personally by me on the following activities: development of treatment plan with patient and/or surrogate as well as nursing, discussions with consultants, evaluation of patient's response to treatment, examination of patient, obtaining history from patient or surrogate, ordering and performing treatments and interventions, ordering and review of laboratory studies, ordering and review of radiographic studies, pulse oximetry and re-evaluation of patient's condition.   MDM    EKG Interpretation  Date/Time:  Friday September 06 2013 05:30:29 EDT Ventricular Rate:  113 PR Interval:  161 QRS Duration: 100 QT Interval:  340 QTC Calculation: 466 R Axis:   -60 Text Interpretation:  Sinus tachycardia LAD, consider left anterior fascicular block Probable anterior infarct, age indeterminate Repol abnrm, severe global ischemia (LM/MVD) Ischemic changes not seen previously Confirmed by Florina Ou  MD, Jenny Reichmann ([id number]) on 08/10/2013 5:35:40 AM       Nursing notes and vitals signs, including pulse oximetry, reviewed.  Summary of this visit's results, reviewed by myself:  Labs:  Results for orders placed during the hospital encounter of 08/18/2013 (from the past 24 hour(s))  PRO B NATRIURETIC PEPTIDE     Status: Abnormal    Collection Time    08/27/2013  5:30 AM      Result Value Ref Range   Pro B Natriuretic peptide (BNP) 21760.0 (*) 0 - 450 pg/mL  CBC WITH DIFFERENTIAL     Status: Abnormal   Collection Time    08/09/2013  5:38 AM      Result Value Ref Range   WBC 3.4 (*) 4.0 - 10.5 K/uL   RBC 2.79 (*) 4.22 - 5.81 MIL/uL   Hemoglobin 9.0 (*) 13.0 - 17.0 g/dL   HCT 28.2 (*) 39.0 - 52.0 %   MCV 101.1 (*) 78.0 - 100.0 fL   MCH 32.3  26.0 - 34.0 pg   MCHC 31.9  30.0 - 36.0 g/dL   RDW 15.7 (*) 11.5 - 15.5 %   Platelets 86 (*) 150 - 400 K/uL   Neutrophils Relative % 65  43 - 77 %   Neutro Abs 2.2  1.7 -  7.7 K/uL   Lymphocytes Relative 21  12 - 46 %   Lymphs Abs 0.7  0.7 - 4.0 K/uL   Monocytes Relative 13 (*) 3 - 12 %   Monocytes Absolute 0.5  0.1 - 1.0 K/uL   Eosinophils Relative 0  0 - 5 %   Eosinophils Absolute 0.0  0.0 - 0.7 K/uL   Basophils Relative 0  0 - 1 %   Basophils Absolute 0.0  0.0 - 0.1 K/uL  BASIC METABOLIC PANEL     Status: Abnormal   Collection Time    09/04/2013  5:38 AM      Result Value Ref Range   Sodium 141  137 - 147 mEq/L   Potassium 5.0  3.7 - 5.3 mEq/L   Chloride 105  96 - 112 mEq/L   CO2 22  19 - 32 mEq/L   Glucose, Bld 137 (*) 70 - 99 mg/dL   BUN 54 (*) 6 - 23 mg/dL   Creatinine, Ser 2.38 (*) 0.50 - 1.35 mg/dL   Calcium 8.8  8.4 - 10.5 mg/dL   GFR calc non Af Amer 22 (*) >90 mL/min   GFR calc Af Amer 26 (*) >90 mL/min   Anion gap 14  5 - 15  I-STAT TROPOININ, ED     Status: Abnormal   Collection Time    09/02/2013  5:45 AM      Result Value Ref Range   Troponin i, poc 3.96 (*) 0.00 - 0.08 ng/mL   Comment 3           APTT     Status: Abnormal   Collection Time    08/11/2013  6:19 AM      Result Value Ref Range   aPTT 71 (*) 24 - 37 seconds  PROTIME-INR     Status: Abnormal   Collection Time    09/02/2013  6:19 AM      Result Value Ref Range   Prothrombin Time 15.7 (*) 11.6 - 15.2 seconds   INR 1.25  0.00 - 1.49    Imaging Studies: Dg Chest 2 View  08/13/2013    CLINICAL DATA:  Increasing shortness of breath.  Pneumonia.  EXAM: CHEST  2 VIEW  COMPARISON:  03/24/2013  FINDINGS: Normal heart size. Diffusely increased pulmonary vascularity. Perihilar infiltrates bilaterally. Infiltrates and vascular congestion are developing since previous study. Calcification in the left pleural space with associated scarring and pleural thickening. This is chronic. Blunting of both costophrenic angles probably represents small effusions although pleural thickening could also have this appearance. Calcification of aorta.  IMPRESSION: The increasing pulmonary vascular congestion and perihilar infiltrates suggesting edema. Small bilateral pleural effusions versus pleural thickening. Calcification, scarring, and pleural thickening on the left lateral chest wall is chronic.   Electronically Signed   By: Lucienne Capers M.D.   On: 08/16/2013 04:07   6:05 AM Discussed with Dr. Martinique who states the patient's EKG is not consistent with a systemic but does show ischemia. He recommends heparinization and admission to cardiology.  7:20 AM Patient's work of breathing is increased in his air movement is decreased. Respirations are in the high 30s. We will give Lasix and trial him on BiPAP. Awaiting call from cardiology.  7:33 AM Dr. Meda Coffee accepts patient for transfer to [facility]. We'll see in the ED. [facility] ED[facility] and Charge Nurse advised of transfer.  7:43 AM Patient sleeping comfortably on BiPAP at this time. Respirations are down to 25.   Melessia Kaus L  Michaiah Maiden, MD 08/22/2013 [place], MD 09/04/2013 [phone]

## 2013-09-06 NOTE — ED Notes (Signed)
Myself, Sarah, RN and Larene Beach, RN ([facility]) undressed pt, placed in gown, on monitor, continuous pulse oximetry, blood pressure cuff and oxygen [place] (5L); Abigail Butts, RT at bedside

## 2013-09-06 NOTE — Progress Notes (Signed)
Dr. Meda Coffee was made aware of the critical result of Troponin 11.51 She said she is going to order another troponin at midnight.

## 2013-09-06 NOTE — Progress Notes (Signed)
Pt's BP has been soft, no urine output, still on Bipap 40%, with on and off agitation/restlessness,PRN has been given,sitter at besdide,daughter was given updates by MD,  lasix  And nitro was on hold per MD, will continue to monitor.

## 2013-09-06 NOTE — Progress Notes (Signed)
Rt called back to room pt taking BIPAP off. MD at bedside stated "ok to leave BIPAP off". Pt is having lots of AMS at time. Neb tx given for [facility] and then [facility] showed up to transfer pt to [facility].

## 2013-09-06 NOTE — ED Notes (Signed)
Pt is a DNR.  Pt from [facility]

## 2013-09-07 DIAGNOSIS — I509 Heart failure, unspecified: Secondary | ICD-10-CM

## 2013-09-07 DIAGNOSIS — I359 Nonrheumatic aortic valve disorder, unspecified: Secondary | ICD-10-CM

## 2013-09-07 DIAGNOSIS — N19 Unspecified kidney failure: Secondary | ICD-10-CM

## 2013-09-07 DIAGNOSIS — I5021 Acute systolic (congestive) heart failure: Secondary | ICD-10-CM

## 2013-09-07 LAB — CBC
HCT: 26 % — ABNORMAL LOW (ref 39.0–52.0)
Hemoglobin: 8.5 g/dL — ABNORMAL LOW (ref 13.0–17.0)
MCH: 32.4 pg (ref 26.0–34.0)
MCHC: 32.7 g/dL (ref 30.0–36.0)
MCV: 99.2 fL (ref 78.0–100.0)
Platelets: 76 10*3/uL — ABNORMAL LOW (ref 150–400)
RBC: 2.62 MIL/uL — ABNORMAL LOW (ref 4.22–5.81)
RDW: 15.8 % — ABNORMAL HIGH (ref 11.5–15.5)
WBC: 4.4 10*3/uL (ref 4.0–10.5)

## 2013-09-07 LAB — BASIC METABOLIC PANEL
Anion gap: 15 (ref 5–15)
BUN: 68 mg/dL — ABNORMAL HIGH (ref 6–23)
CO2: 20 mEq/L (ref 19–32)
Calcium: 8 mg/dL — ABNORMAL LOW (ref 8.4–10.5)
Chloride: 107 mEq/L (ref 96–112)
Creatinine, Ser: 3.56 mg/dL — ABNORMAL HIGH (ref 0.50–1.35)
GFR calc Af Amer: 16 mL/min — ABNORMAL LOW (ref >90)
GFR calc non Af Amer: 14 mL/min — ABNORMAL LOW (ref >90)
Glucose, Bld: 134 mg/dL — ABNORMAL HIGH (ref 70–99)
Potassium: 6.4 mEq/L — ABNORMAL HIGH (ref 3.7–5.3)
Sodium: 142 mEq/L (ref 137–147)

## 2013-09-07 LAB — TROPONIN I

## 2013-09-07 MED ORDER — DEXTROSE 50 % IV SOLN
1.0000 | INTRAVENOUS | Status: AC
Start: 2013-09-07 — End: 2013-09-07
  Administered 2013-09-07: 50 mL via INTRAVENOUS
  Filled 2013-09-07: qty 50

## 2013-09-07 MED ORDER — DEXTROSE 5 % IV SOLN
1.0000 g | Freq: Every day | INTRAVENOUS | Status: DC
  Administered 2013-09-07: 1 g via INTRAVENOUS
  Filled 2013-09-07 (×2): qty 10

## 2013-09-07 MED ORDER — FUROSEMIDE 10 MG/ML IJ SOLN
80.0000 mg | INTRAMUSCULAR | Status: AC
  Administered 2013-09-07: 80 mg via INTRAVENOUS
  Filled 2013-09-07: qty 8

## 2013-09-07 MED ORDER — MORPHINE SULFATE 2 MG/ML IJ SOLN
1.0000 mg | INTRAMUSCULAR | Status: DC | PRN
  Administered 2013-09-07 (×2): 1 mg via INTRAVENOUS
  Filled 2013-09-07 (×2): qty 1

## 2013-09-07 MED ORDER — MORPHINE SULFATE 4 MG/ML IJ SOLN
4.0000 mg | Freq: Once | INTRAMUSCULAR | Status: AC
  Administered 2013-09-07: 4 mg via INTRAVENOUS
  Filled 2013-09-07: qty 1

## 2013-09-07 MED ORDER — MORPHINE SULFATE 10 MG/ML IJ SOLN
5.0000 mg/h | INTRAMUSCULAR | Status: DC
  Administered 2013-09-07: 5 mg/h via INTRAVENOUS
  Filled 2013-09-07: qty 10

## 2013-09-07 MED ORDER — DEXTROSE 50 % IV SOLN
INTRAVENOUS | Status: AC
  Administered 2013-09-07: 50 mL via INTRAVENOUS
  Filled 2013-09-07: qty 50

## 2013-09-07 MED ORDER — CALCIUM GLUCONATE 10 % IV SOLN
1.0000 g | INTRAVENOUS | Status: AC
  Administered 2013-09-07: 1 g via INTRAVENOUS
  Filled 2013-09-07: qty 10

## 2013-09-07 MED ORDER — INSULIN ASPART 100 UNIT/ML ~~LOC~~ SOLN
10.0000 [IU] | SUBCUTANEOUS | Status: AC
  Administered 2013-09-07: 10 [IU] via INTRAVENOUS

## 2013-09-07 DEATH — deceased

## 2013-09-10 ENCOUNTER — Telehealth: Payer: Self-pay | Admitting: Cardiology

## 2013-09-10 ENCOUNTER — Ambulatory Visit: Payer: Medicare Other | Admitting: Cardiology

## 2013-09-10 NOTE — Telephone Encounter (Signed)
Original d/c received From [place] Will be in office 8/5/215 and  We Will see if she Will sign.   8.4.15/km

## 2013-09-11 ENCOUNTER — Telehealth: Payer: Self-pay | Admitting: Cardiology

## 2013-09-11 NOTE — Telephone Encounter (Signed)
D/C taken to Dr. Meda Coffee to Obtain Signature  8.5.15/km

## 2013-09-11 NOTE — Discharge Summary (Signed)
Physician Discharge Summary   Patient ID: Colin Hudson [id number] 80 y.o. Nov 15, 1918  Admit date: 08/16/2013  Discharge date and time: 20-Sep-2013 10:36 PM   Admitting Physician: Sueanne Margarita, MD   Discharge Physician: Ena Dawley, MD  Admission Diagnoses: STEMI (ST elevated myocardial infarction) [213.08] Acute systolic congestive heart failure [428.21, 428.0] Acute respiratory failure with hypoxia [518.81] Acute on chronic systolic and diastolic heart failure, NYHA class 4 [428.43] Acute renal failure, unspecified acute renal failure type [584.9] ST elevation myocardial infarction involving left anterior descending (LAD) coronary artery [410.10]  Discharge Diagnoses: STEMI (ST elevated myocardial infarction) [657.84] Acute systolic congestive heart failure [428.21, 428.0] Acute respiratory failure with hypoxia [518.81] Acute on chronic systolic and diastolic heart failure, NYHA class 4 [428.43] Acute renal failure, unspecified acute renal failure type [584.9] ST elevation myocardial infarction involving left anterior descending (LAD) coronary artery  Admission Condition: Critical  Discharged Condition: The patient expired.  Indication for Admission: STEMI  Hospital Course: A 80 y.o. male with a PMHx of COPD and combined systolic and diastolic CHF with AR and MR, RV dysfunction who presented with progressively worsening SOB. He states he has had worsening shortness of breath over the last 3 days. It became moderate to severe on the day of admission. He denies chest pain. He denies nausea, vomiting or diarrhea. He was wheezing on arrival and was given a DuoNeb treatment. He continued to be tachypnic. He was started on BiPAP for an acute respiratory failure. Physical exam showed significant fluid overload and he has received iv Lasix. His ECG showed ST elevations in the anterior leads. Cath lab was activated, but because of severe acute on chronic kidney failure, advanced  age, DNR status at the admission, the decision was made to treat conservatively. The patient was transferred to ICU at the [facility]. Echocardiogram showed LVEF 20-25% with akinetic anterior wall. The renal function continued to deteriorate. The patient's mental status continued to deteriorate. A family meeting was made with patient's daughter and her husband. The patient expressed wish that he would only like to live if he had a good quality of life. Since the prognosis was poor the daughter and her husband decided to continue comfort measures only. It was decided not to intubate the patient and not to start the hemodialysis. The patient expired at 8 pm on 09-20-13.   Consults: Palliative care  Significant Diagnostic Studies: echocardiogram: September 20, 2013 Study Conclusions  - Left ventricle: The cavity size was mildly dilated. Systolic function was severely reduced. The estimated ejection fraction was in the range of 20% to 25%. Doppler parameters are consistent with a reversible restrictive pattern, indicative of decreased left ventricular diastolic compliance and/or increased left atrial pressure (grade 3 diastolic dysfunction). Doppler parameters are consistent with both elevated ventricular end-diastolic filling pressure and elevated left atrial filling pressure. - Aortic valve: Cusp separation was moderately reduced. There was moderate regurgitation. - Mitral valve: There was moderate regurgitation. - Left atrium: The atrium was moderately dilated. - Right ventricle: The cavity size was moderately dilated. Wall thickness was normal. Systolic function was mildly reduced. - Right atrium: The atrium was mildly dilated. - Tricuspid valve: There was moderate regurgitation. - Pulmonary arteries: Systolic pressure was moderately increased. PA peak pressure: 52 mm Hg (S).  Impressions:  - Compared to the prior study from 03/2013 LVEF is now severely impaired with new akinesis  in the entire anterior and mid and distal anteroseptal and anterolateral walls onsistent with a large MI in  the proximal LAD or LM. There is restrictive pattern of diastolic dysfunction with high filling pressures. Moderate pulmonary hypertension.    Treatments: BiPAP  Disposition: 20-Expired  SignedDorothy Spark 09/11/2013 10:05 PM

## 2013-09-11 NOTE — Telephone Encounter (Signed)
Dr.Nelson Signed d/c For Pt, Colin Hudson W/ Richrd Humbles is aware ready For pick up 8.5.15/km

## 2013-09-25 ENCOUNTER — Ambulatory Visit: Payer: Medicare Other | Admitting: Oncology

## 2013-09-26 ENCOUNTER — Ambulatory Visit: Payer: Medicare Other | Admitting: Cardiology

## 2013-10-08 NOTE — Progress Notes (Signed)
Pt expired at 2000, Morphine 1ml wasted with Ailene Ravel RN.

## 2013-10-08 NOTE — Progress Notes (Signed)
Echocardiogram 2D Echocardiogram has been performed.  Joelene Millin Sep 12, 2013, 11:26 AM

## 2013-10-08 NOTE — Progress Notes (Signed)
Patient Name: Colin Hudson Date of Encounter: 09/23/2013  Active Problems:   STEMI (ST elevation myocardial infarction)   Acute on chronic systolic and diastolic heart failure, NYHA class 4   Acute respiratory failure   Length of Stay: 1  SUBJECTIVE  The patient is somnolent, appears uncomfortable and mildly agitated. Per daughter he noted earlier that he is in pain.   CURRENT MEDS . albuterol  10 mg/hr Nebulization Once  . atorvastatin  40 mg Oral q1800  . carvedilol  3.125 mg Oral BID WC  . cefTRIAXone (ROCEPHIN)  IV  1 g Intravenous QHS  . clopidogrel  75 mg Oral Q breakfast  . furosemide  40 mg Intravenous BID  . levalbuterol  1.25 mg Nebulization QID  . pilocarpine  1 drop Both Eyes QID  . tamsulosin  0.4 mg Oral Daily  . Travoprost (BAK Free)  1 drop Both Eyes QHS    OBJECTIVE  Filed Vitals:   08/22/2013 2122 2013-09-23 0007 09/23/2013 0332 2013-09-23 0833  BP:  97/56 105/60 92/76  Pulse:  101 91 95  Temp:  97.6 F (36.4 C) 98.1 F (36.7 C) 98.3 F (36.8 C)  TempSrc:  Axillary Axillary Oral  Resp:  31 28 35  Height:      Weight:   196 lb 6.9 oz (89.1 kg)   SpO2: 96% 100% 93% 94%    Intake/Output Summary (Last 24 hours) at Sep 23, 2013 1026 Last data filed at 09-23-13 0600  Gross per 24 hour  Intake     80 ml  Output    150 ml  Net    -70 ml   Filed Weights   08/24/2013 0618 2013/09/23 0332  Weight: 184 lb (83.462 kg) 196 lb 6.9 oz (89.1 kg)    PHYSICAL EXAM  General: Pleasant, NAD. Neuro: Alert and oriented X 3. Moves all extremities spontaneously. Psych: Normal affect. HEENT:  Normal  Neck: Supple without bruits or JVD. Lungs:  Resp regular and unlabored, CTA. Heart: RRR no s3, s4, or murmurs. Abdomen: Soft, non-tender, non-distended, BS + x 4.  Extremities: No clubbing, cyanosis or edema. DP/PT/Radials 2+ and equal bilaterally.  Accessory Clinical Findings  CBC  Recent Labs  08/13/2013 0538 09/23/13 0545  WBC 3.4* 4.4  NEUTROABS 2.2  --   HGB  9.0* 8.5*  HCT 28.2* 26.0*  MCV 101.1* 99.2  PLT 86* 76*   Basic Metabolic Panel  Recent Labs  08/25/2013 0538 09-23-2013 0545  NA 141 142  K 5.0 6.4*  CL 105 107  CO2 22 20  GLUCOSE 137* 134*  BUN 54* 68*  CREATININE 2.38* 3.56*  CALCIUM 8.8 8.0*   Cardiac Enzymes  Recent Labs  08/23/2013 1815 08/26/2013 2317 09-23-2013 0545  TROPONINI 11.51* >20.00* >20.00*   Radiology/Studies  Dg Chest 2 View  08/13/2013   CLINICAL DATA:  Increasing shortness of breath.  IMPRESSION: The increasing pulmonary vascular congestion and perihilar infiltrates suggesting edema. Small bilateral pleural effusions versus pleural thickening. Calcification, scarring, and pleural thickening on the left lateral chest wall is chronic.     TELE: SR, 80 BPM  ECG: STE in aVR, V1 and diffuse reciprocal STD     ASSESSMENT AND PLAN  1. STEMI; not a candidate for a cath - acute on chronic kidney failure, worsening function with anuria, advanced age, LVEF 15% with akinetic anterior and anteroseptal wall.  I had a discussion with patient's daughter and her husband. The patient always wished for good quality of life. Since his  prognosis is very poor they wish to change to comfort care only. We will consult palliative care. We will stop heparin infusion.   2. Acute on chronic Combined Systolic and Diastolic CHF, LVEF 76-54% in 2/15, now 15%: BNP > 20.000, He has volume excess on exam, however he is almost anuric, HD is not an option.  3. Respiratory failure - secondary to CHF with pulmonary edema, we will start morphine infusion for comfort care, d/c lasix  4. Acute on CKD - worsening with anuria, hold lasix   5. 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

## 2013-10-08 NOTE — Progress Notes (Signed)
Central Monitoring called to notify RN of asystole on the monitor.  Upon assessment pt was found without pulse, cessation of breath and no heart sounds could be auscultated.  The above findings were verified by 2 RN Carlena Sax and Stormie Duffy. Family member notified,  Leretha Pol. MD notified, spoke to Dr. Delane Ginger who advised to call attending,  Dr. Radford Pax, in the morning.

## 2013-10-08 NOTE — Significant Event (Signed)
Paged @ 6:55am re worsened renal function, metabolic disarray, and K 6.4. Patient remains on BiPAP altered, intermittently agitated. Poor UOP.   Insulin, dextrose, calcium gluconate, and addition dose lasix ordered to treat K as temporizing measure. Unable to give oral kayexalate due to BiPAP mask.   Plan, based on my discussion with patients daughter overnight, is to re-address GOC with day team and potentially transition to comfort measures.  Lamar Sprinkles, MD

## 2013-10-08 NOTE — Progress Notes (Signed)
Patient continues to be restless with facial grimacing. Daughter at bedside. I paged MD regarding. New orders for prn Morphine . Will cont. To monitor

## 2013-10-08 NOTE — Progress Notes (Signed)
Cross Cover Note  Troponin continues to rise.   UA positive  Plan - start CTX for UTI - continue discussions during day re treatment for NSTEMI

## 2013-10-08 NOTE — Progress Notes (Signed)
Pt is on morphine gtt since 11:30, RR is now at mid 20's to 30 on bipap at 40%,,HR at 80's noted less restlessness, turned and repositioned for comfort, family at beside.

## 2013-10-08 NOTE — Progress Notes (Signed)
CRITICAL VALUE ALERT  Critical value received:  troponin  Date of notification: 2013-09-24  Time of notification:  0000  Critical value read back:Yes.    Nurse who received alert:  Magda Paganini Elijha Dedman  MD notified (1st page):  yes  Time of first page:  0000  MD notified (2nd page):  Time of second page:  Responding MD:  Dr Tommi Rumps  Time MD responded:  [phone]

## 2013-10-08 DEATH — deceased

## 2014-12-18 IMAGING — CT CT ABD-PELV W/O CM
2 of 4 series · 16 of 46 positions shown, 18 images · non-contrast
Comparison: 03/24/2013

CLINICAL DATA: Thrombocytopenia. Followup of duodenal mass. Chronic
kidney disease.

EXAM:
CT ABDOMEN AND PELVIS WITHOUT CONTRAST
TECHNIQUE: Multidetector CT imaging of the abdomen and pelvis was performed
following the standard protocol without IV contrast.

[Series 2: rtn a/p w/o · axial · non-contrast · 0.78mm/px · z∈[-486,-96]mm · 13 of 90 slices shown, 15 images]
[im 6/90  soft-tissue]
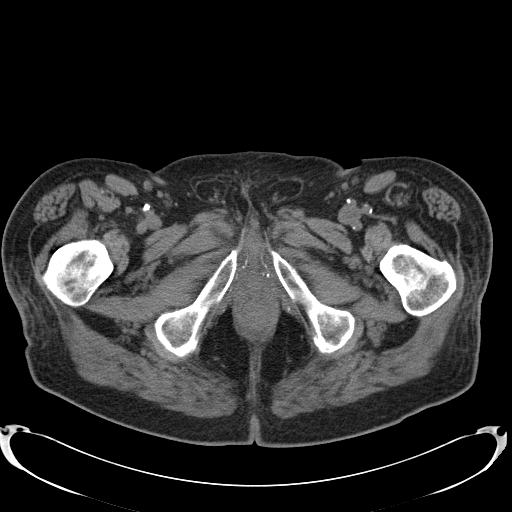
[im 6/90  bone]
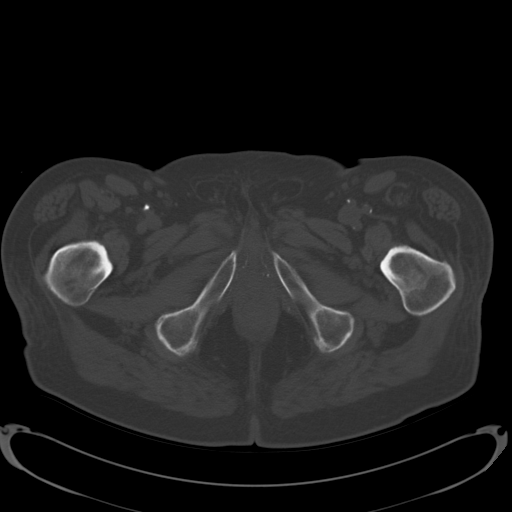
[im 12/90  soft-tissue]
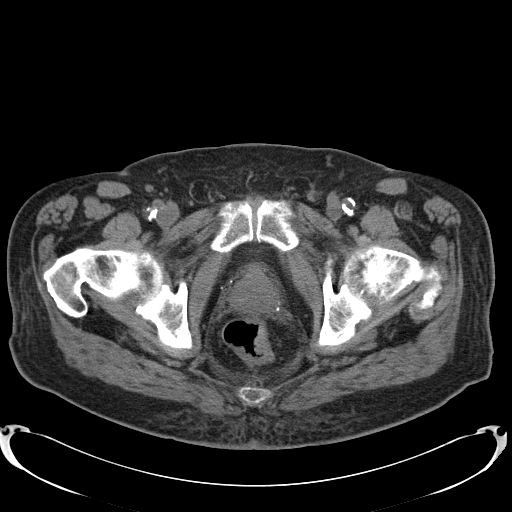
[im 18/90  soft-tissue]
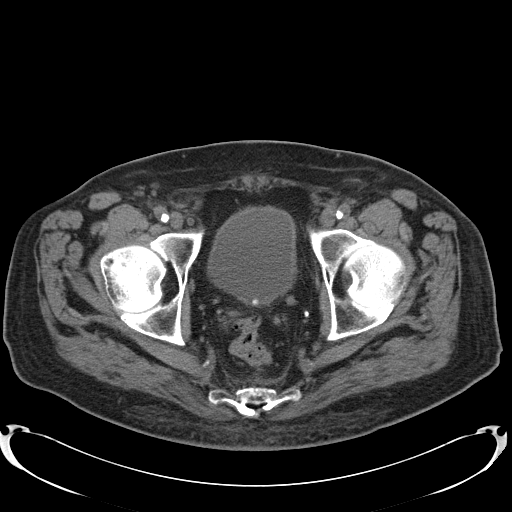
[im 24/90  soft-tissue]
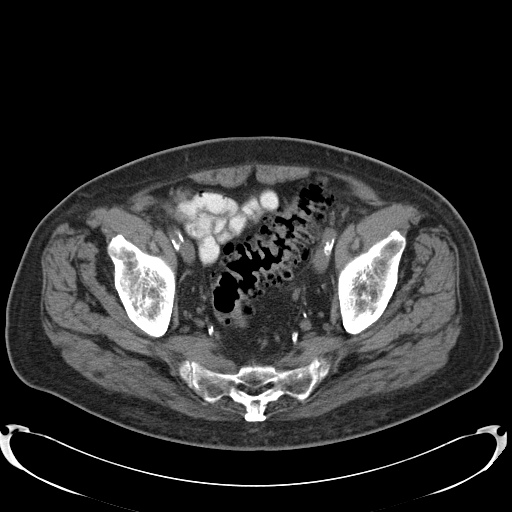
[im 30/90  soft-tissue]
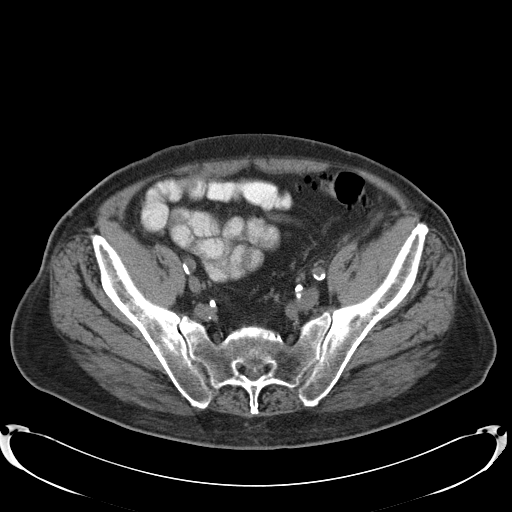
[im 36/90  soft-tissue]
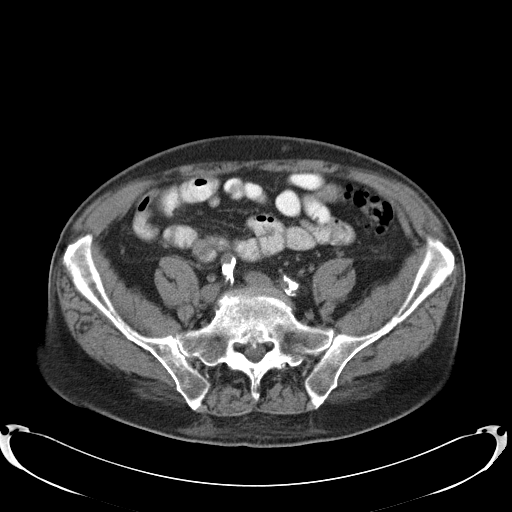
[im 48/90  soft-tissue]
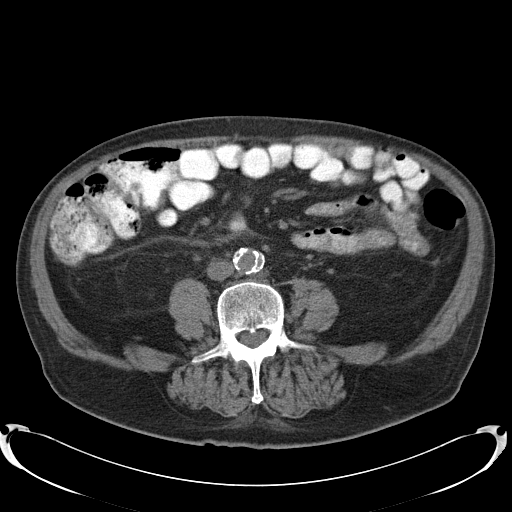
[im 54/90  soft-tissue]
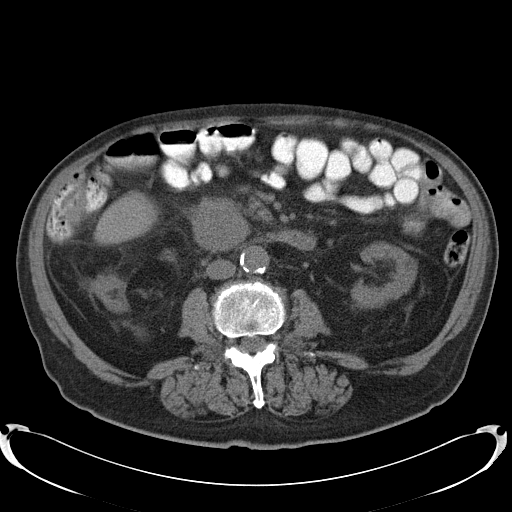
[im 60/90  soft-tissue]
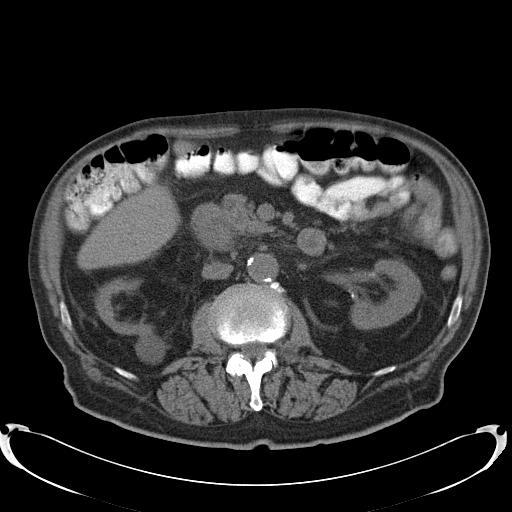
[im 60/90  bone]
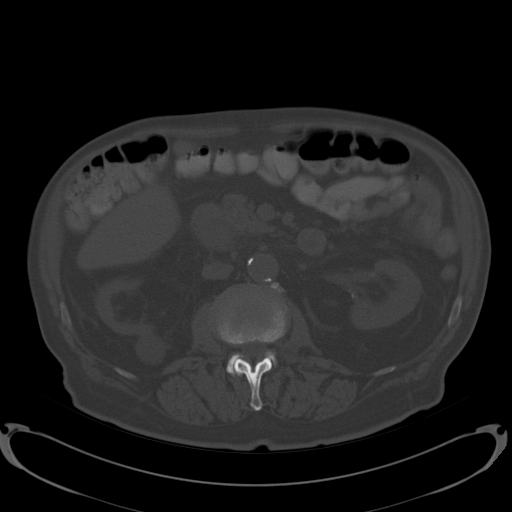
[im 66/90  soft-tissue]
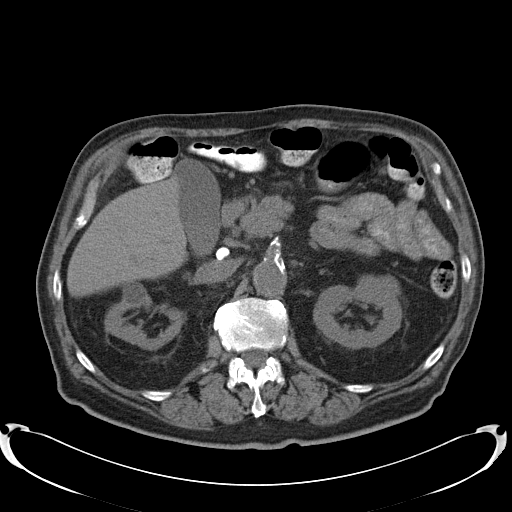
[im 72/90  soft-tissue]
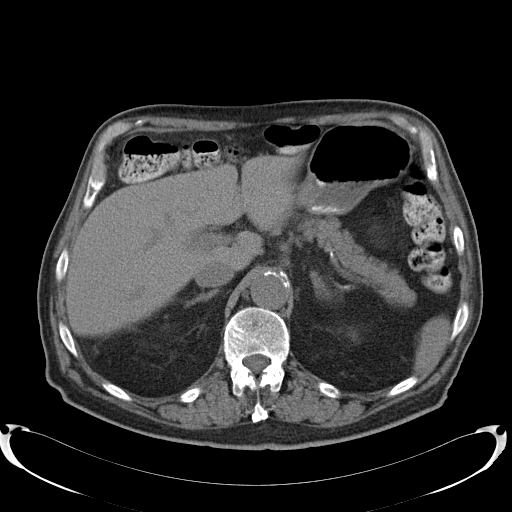
[im 78/90  soft-tissue]
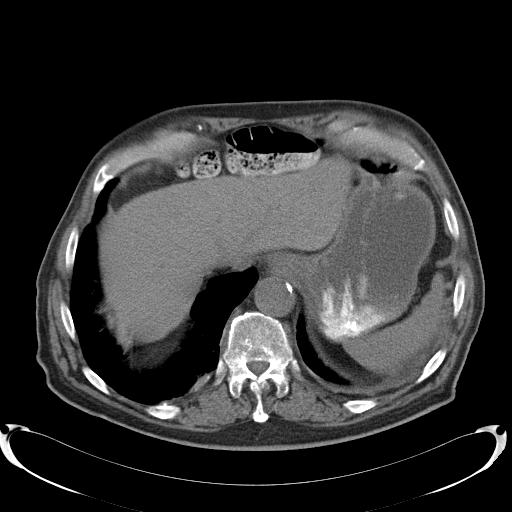
[im 84/90  soft-tissue]
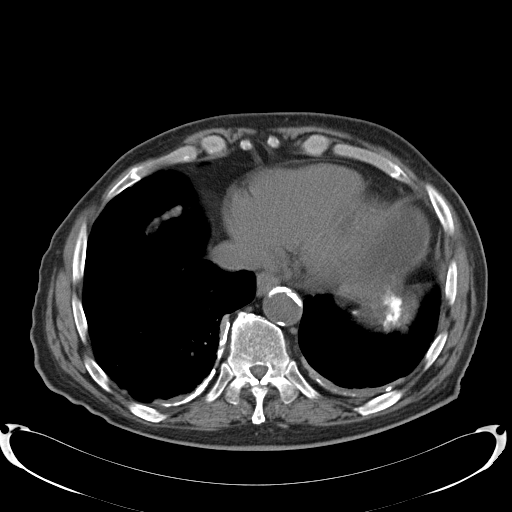

[Series 602: <mpr thick range> · coronal · 0.88mm/px · 3 of 98 slices shown]
[im 33/98  soft-tissue]
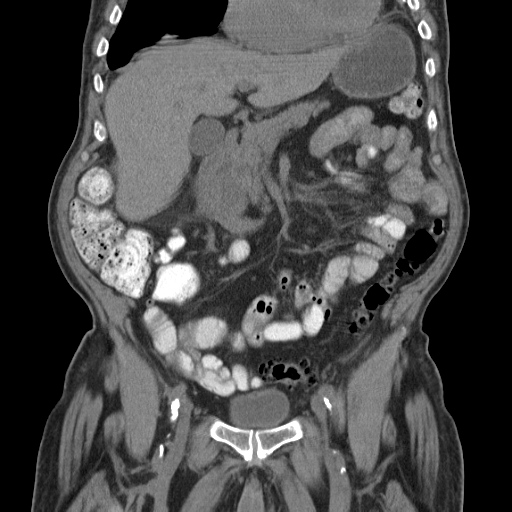
[im 44/98  soft-tissue]
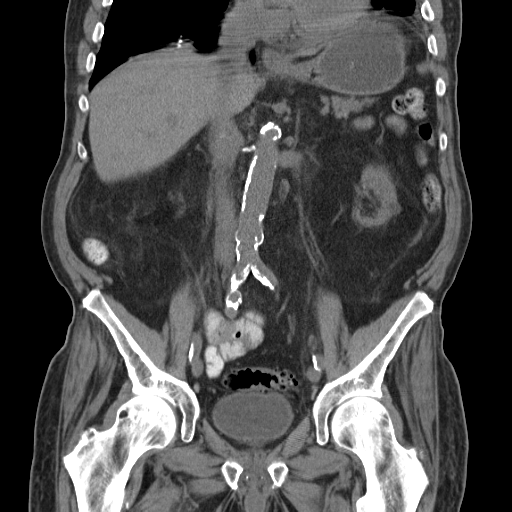
[im 54/98  soft-tissue]
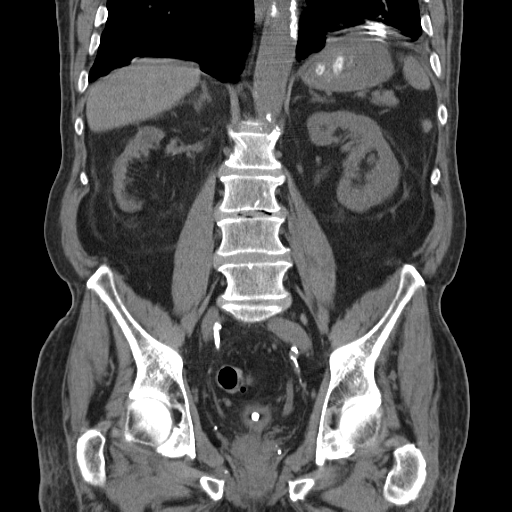

[16 of 46 positions shown; findings below may reference images not displayed]

FINDINGS: Lower Chest: Left base volume loss. Mild cardiomegaly with coronary
artery atherosclerosis. Bilateral calcified pleural plaques.

Abdomen/Pelvis: Motion degradation throughout the lower abdomen and
upper pelvis. Normal noncontrast appearance of the liver, spleen,
stomach.

Normal pancreas, gallbladder. Calcification adjacent the gallbladder
is likely related to old granulomatous disease.

Soft tissue mass positioned about the posterior inferior aspect of
the inferior genu of the duodenum measures 3.5 x 4.6 cm on image 35/
series 2. Enlarged from 2.5 x 3.7 on the prior exam. 4.7 cm
craniocaudal on image 36 versus 3.4 cm on the prior exam at the same
level (when remeasured). No bowel obstruction or other acute
complication.

Normal adrenal glands. Mild right renal atrophy. Bilateral renal
cysts. Renal vascular calcifications with suspicion of at least 1
punctate left renal collecting system calculus. No ureteric stone.

Aortic and branch vessel atherosclerosis. No retroperitoneal or
retrocrural adenopathy. Extensive colonic diverticulosis. Normal
terminal ileum and appendix. Normal small bowel without abdominal
ascites.

Bilateral fat containing inguinal hernias. No pelvic adenopathy.
Normal prostate. Dependent bladder stone of 6 mm. No significant
free fluid.

Bones/Musculoskeletal: Moderate osteopenia. Degenerative disc
disease at L3-4 and L5-S1.
IMPRESSION: 1. Motion degraded exam.
2. Interval enlargement of mass centered along the inferior a genu
of the duodenum. Suspicious for GI stromal tumor. No evidence of
metastatic disease or complicating obstruction.
3. Left nephrolithiasis.
4. Asbestos related pleural disease.
5.  Atherosclerosis, including within the coronary arteries.
6. Bladder calculus, as before.

## 2014-12-28 IMAGING — CR DG CHEST 2V
2 series · 2 of 2 positions shown · non-contrast
Comparison: 03/24/2013

CLINICAL DATA: Increasing shortness of breath.  Pneumonia.

EXAM:
CHEST  2 VIEW

[w chest lat]
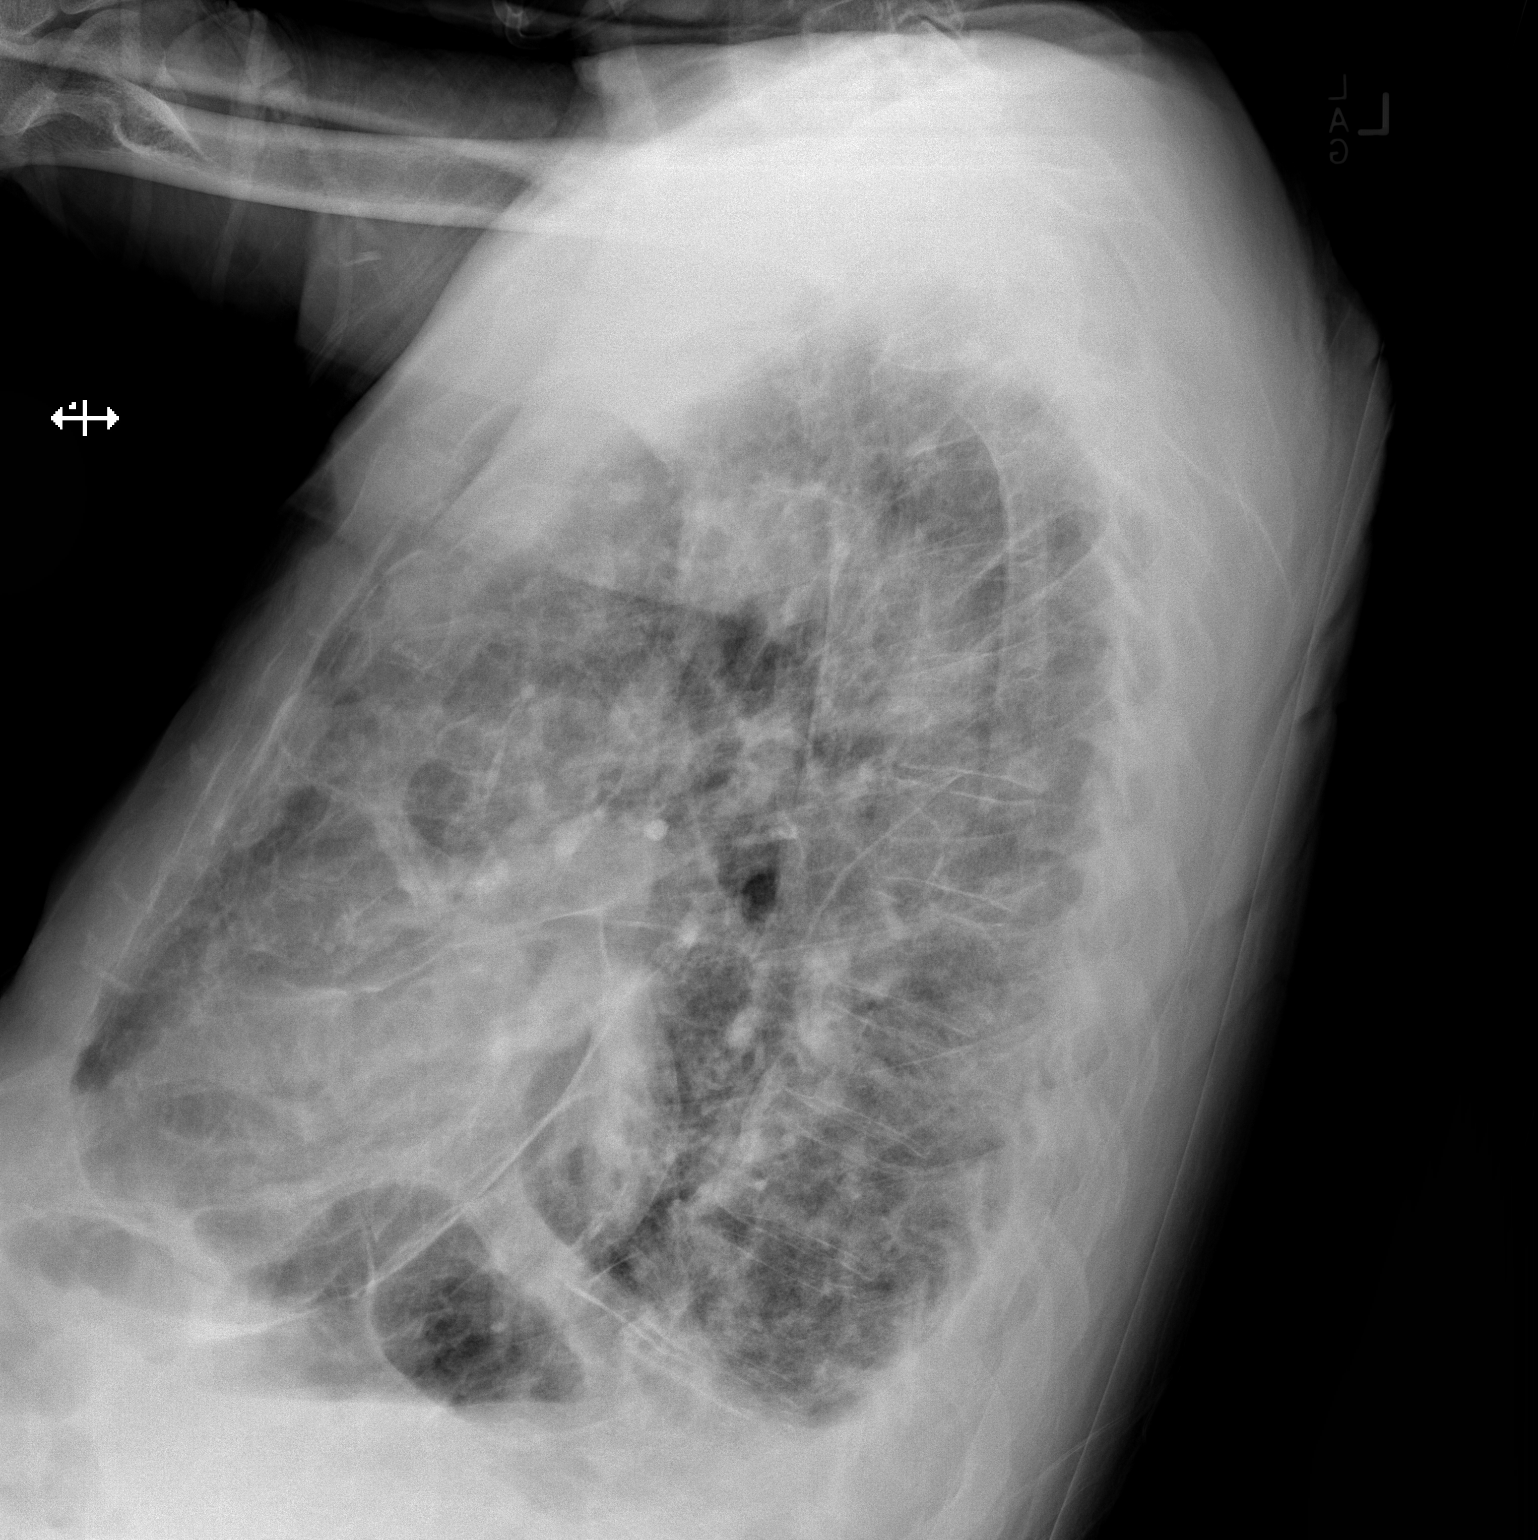

[x chest ap]
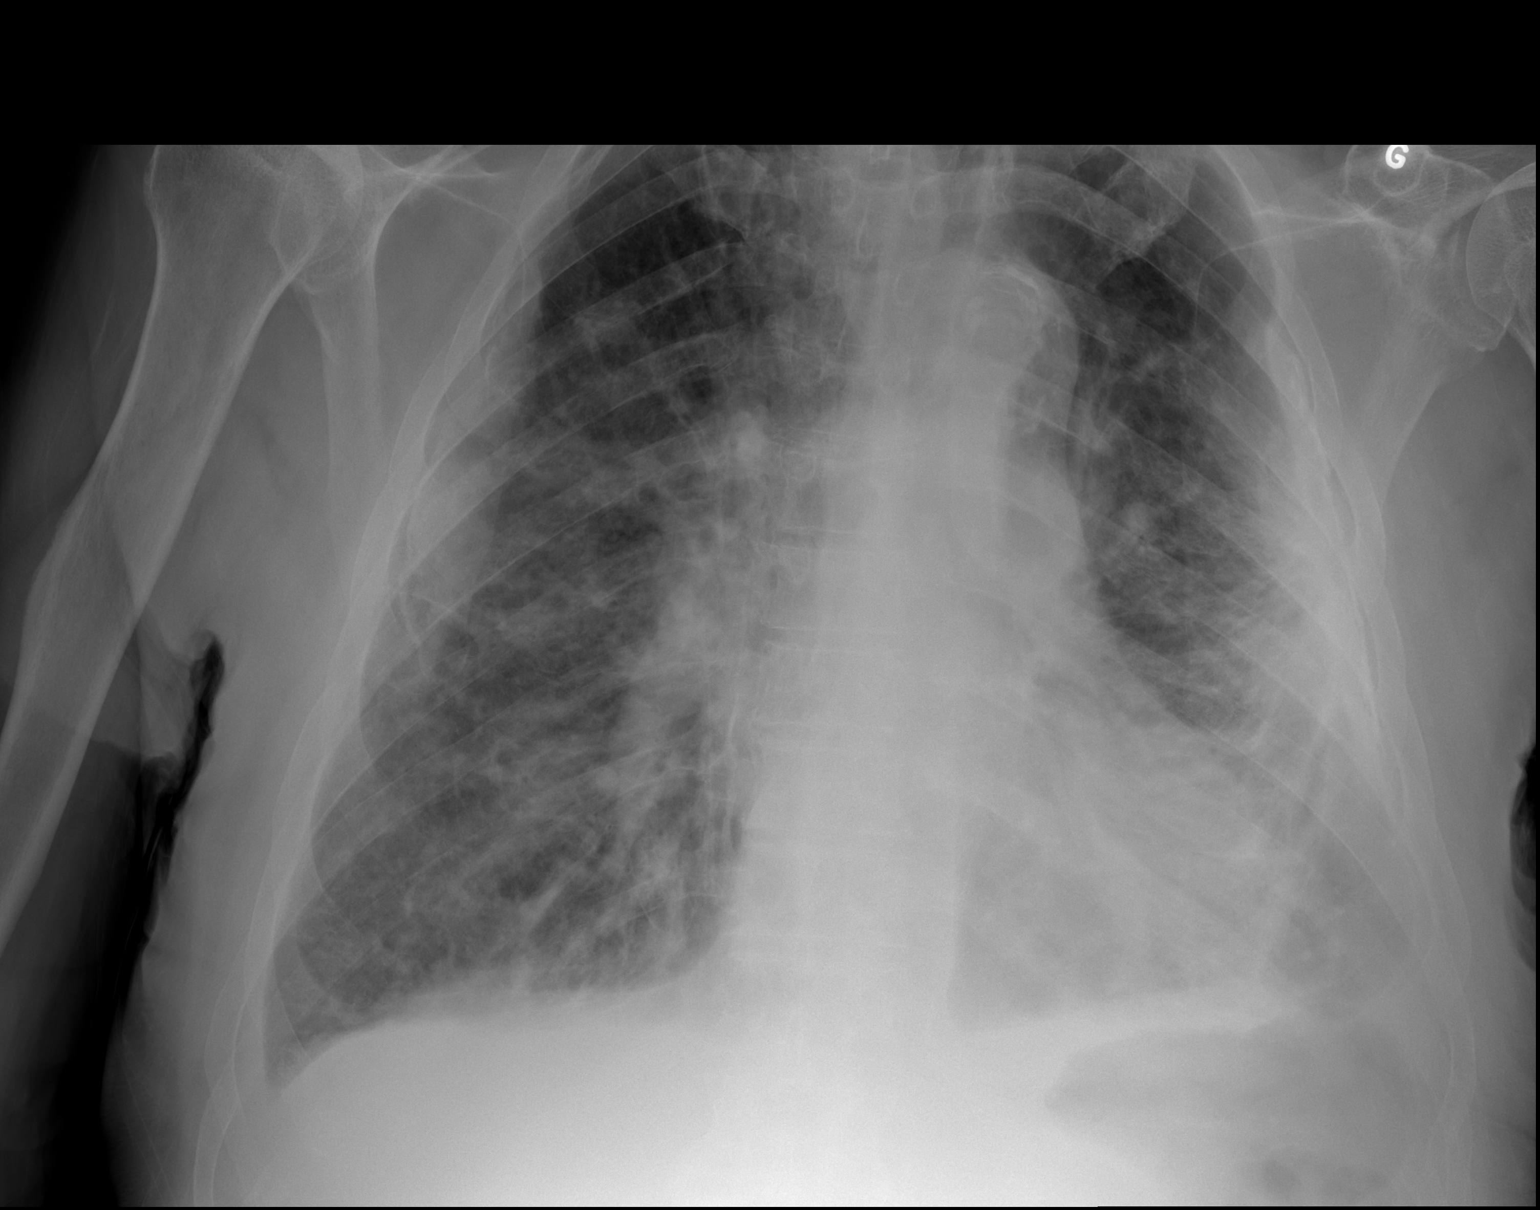

[2 of 2 positions shown; findings below may reference images not displayed]

FINDINGS: Normal heart size. Diffusely increased pulmonary vascularity.
Perihilar infiltrates bilaterally. Infiltrates and vascular
congestion are developing since previous study. Calcification in the
left pleural space with associated scarring and pleural thickening.
This is chronic. Blunting of both costophrenic angles probably
represents small effusions although pleural thickening could also
have this appearance. Calcification of aorta.
IMPRESSION: The increasing pulmonary vascular congestion and perihilar
infiltrates suggesting edema. Small bilateral pleural effusions
versus pleural thickening. Calcification, scarring, and pleural
thickening on the left lateral chest wall is chronic.
